# Patient Record
Sex: Male | Born: 1990 | Race: Black or African American | Hispanic: No | Marital: Single | Smoking: Former smoker
Health system: Southern US, Community
[De-identification: ages and names within clinical notes are randomized; demographics above are authoritative.]

---
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
## 2013-10-11 ENCOUNTER — Emergency Department (HOSPITAL_COMMUNITY)
Admission: EM | Admit: 2013-10-11 | Discharge: 2013-10-11 | Disposition: A | Discharge status: Home / Self Care | Payer: Self-pay | Attending: Emergency Medicine | Admitting: Emergency Medicine

## 2013-10-11 ENCOUNTER — Encounter (HOSPITAL_COMMUNITY): Payer: Self-pay | Admitting: Emergency Medicine

## 2013-10-11 DIAGNOSIS — Z87891 Personal history of nicotine dependence: Secondary | ICD-10-CM | POA: Insufficient documentation

## 2013-10-11 DIAGNOSIS — N342 Other urethritis: Secondary | ICD-10-CM | POA: Insufficient documentation

## 2013-10-11 DIAGNOSIS — R300 Dysuria: Secondary | ICD-10-CM | POA: Insufficient documentation

## 2013-10-11 LAB — URINALYSIS, ROUTINE W REFLEX MICROSCOPIC
BILIRUBIN URINE: NEGATIVE
Glucose, UA: NEGATIVE mg/dL
HGB URINE DIPSTICK: NEGATIVE
Ketones, ur: NEGATIVE mg/dL
NITRITE: NEGATIVE
PH: 6.5 (ref 5.0–8.0)
Protein, ur: NEGATIVE mg/dL
Specific Gravity, Urine: 1.007 (ref 1.005–1.030)
UROBILINOGEN UA: 0.2 mg/dL (ref 0.0–1.0)

## 2013-10-11 LAB — RAPID URINE DRUG SCREEN, HOSP PERFORMED
Amphetamines: NOT DETECTED
BENZODIAZEPINES: NOT DETECTED
Barbiturates: NOT DETECTED
COCAINE: NOT DETECTED
OPIATES: NOT DETECTED
Tetrahydrocannabinol: POSITIVE — AB

## 2013-10-11 LAB — RPR

## 2013-10-11 LAB — URINE MICROSCOPIC-ADD ON

## 2013-10-11 LAB — HIV ANTIBODY (ROUTINE TESTING W REFLEX): HIV 1&2 Ab, 4th Generation: NONREACTIVE

## 2013-10-11 MED ORDER — LIDOCAINE HCL (PF) 1 % IJ SOLN
2.1000 mL | Freq: Once | INTRAMUSCULAR | Status: AC
  Administered 2013-10-11: 2.1 mL
  Filled 2013-10-11: qty 5

## 2013-10-11 MED ORDER — CEFTRIAXONE SODIUM 250 MG IJ SOLR
250.0000 mg | Freq: Once | INTRAMUSCULAR | Status: AC
  Administered 2013-10-11: 250 mg via INTRAMUSCULAR
  Filled 2013-10-11: qty 250

## 2013-10-11 MED ORDER — AZITHROMYCIN 250 MG PO TABS
2000.0000 mg | ORAL_TABLET | Freq: Once | ORAL | Status: DC
  Filled 2013-10-11 (×2): qty 8

## 2013-10-11 MED ORDER — AZITHROMYCIN 500 MG PO TABS
1000.0000 mg | ORAL_TABLET | ORAL | Status: AC
  Administered 2013-10-11: 1000 mg via ORAL
  Filled 2013-10-11: qty 2

## 2013-10-11 NOTE — ED Notes (Signed)
Paged and called x2

## 2013-10-11 NOTE — ED Notes (Signed)
Communication with Pharmacy to send 4 500mg  tabs.

## 2013-10-11 NOTE — ED Notes (Signed)
Pt in c/o penile pain during urination and penile discharge, states he wants to be checked for STDs, no known exposure

## 2013-10-11 NOTE — Discharge Instructions (Signed)
Urethritis Urethritis is an inflammation of the tube through which urine exits your bladder (urethra).  CAUSES Urethritis is often caused by an infection in your urethra. The infection can be viral, like herpes. The infection can also be bacterial, like gonorrhea. RISK FACTORS Risk factors of urethritis include:  Having sex without using a condom.  Having multiple sexual partners.  Having poor hygiene. SIGNS AND SYMPTOMS Symptoms of urethritis are less noticeable in women than in men. These symptoms include:  Burning feeling when you urinate (dysuria).  Discharge from your urethra.  Blood in your urine (hematuria).  Urinating more than usual. DIAGNOSIS  To confirm a diagnosis of urethritis, your health care provider will do the following:  Ask about your sexual history.  Perform a physical exam.  Have you provide a sample of your urine for lab testing.  Use a cotton swab to gently collect a sample from your urethra for lab testing. TREATMENT  It is important to treat urethritis. Depending on the cause, untreated urethritis may lead to serious genital infections and possibly infertility. Urethritis caused by a bacterial infection is treated with antibiotic medicine. All sexual partners must be treated.  HOME CARE INSTRUCTIONS  Do not have sex until the test results are known and treatment is completed, even if your symptoms go away before you finish treatment.  If you were prescribed an antibiotic, finish it all even if you start to feel better. SEEK MEDICAL CARE IF:   Your symptoms are not improved in 3 days.  Your symptoms are getting worse.  You develop abdominal pain or pelvic pain (in women).  You develop joint pain.  You have a fever. SEEK IMMEDIATE MEDICAL CARE IF:   You have severe pain in the belly, back, or side.  You have repeated vomiting. MAKE SURE YOU:  Understand these instructions.  Will watch your condition.  Will get help right away if you  are not doing well or get worse. Document Released: 09/02/2000 Document Revised: 07/24/2013 Document Reviewed: 11/07/2012 [facility] Patient Information 2015 [place], [place]. This information is not intended to replace advice given to you by your health care provider. Make sure you discuss any questions you have with your health care provider.  Sexually Transmitted Disease A sexually transmitted disease (STD) is a disease or infection that may be passed (transmitted) from person to person, usually during sexual activity. This may happen by way of saliva, semen, blood, vaginal mucus, or urine. Common STDs include:   Gonorrhea.   Chlamydia.   Syphilis.   HIV and AIDS.   Genital herpes.   Hepatitis B and C.   Trichomonas.   Human papillomavirus (HPV).   Pubic lice.   Scabies.  Mites.  Bacterial vaginosis. WHAT ARE CAUSES OF STDs? An STD may be caused by bacteria, a virus, or parasites. STDs are often transmitted during sexual activity if one person is infected. However, they may also be transmitted through nonsexual means. STDs may be transmitted after:   Sexual intercourse with an infected person.   Sharing sex toys with an infected person.   Sharing needles with an infected person or using unclean piercing or tattoo needles.  Having intimate contact with the genitals, mouth, or rectal areas of an infected person.   Exposure to infected fluids during birth. WHAT ARE THE SIGNS AND SYMPTOMS OF STDs? Different STDs have different symptoms. Some people may not have any symptoms. If symptoms are present, they may include:   Painful or bloody urination.   Pain  in the pelvis, abdomen, vagina, anus, throat, or eyes.   A skin rash, itching, or irritation.  Growths, ulcerations, blisters, or sores in the genital and anal areas.  Abnormal vaginal discharge with or without bad odor.   Penile discharge in men.   Fever.   Pain or bleeding during sexual  intercourse.   Swollen glands in the groin area.   Yellow skin and eyes (jaundice). This is seen with hepatitis.   Swollen testicles.  Infertility.  Sores and blisters in the mouth. HOW ARE STDs DIAGNOSED? To make a diagnosis, your health care provider may:   Take a medical history.   Perform a physical exam.   Take a sample of any discharge to examine.  Swab the throat, cervix, opening to the penis, rectum, or vagina for testing.  Test a sample of your first morning urine.   Perform blood tests.   Perform a Pap test, if this applies.   Perform a colposcopy.   Perform a laparoscopy.  HOW ARE STDs TREATED? Treatment depends on the STD. Some STDs may be treated but not cured.   Chlamydia, gonorrhea, trichomonas, and syphilis can be cured with antibiotic medicine.   Genital herpes, hepatitis, and HIV can be treated, but not cured, with prescribed medicines. The medicines lessen symptoms.   Genital warts from HPV can be treated with medicine or by freezing, burning (electrocautery), or surgery. Warts may come back.   HPV cannot be cured with medicine or surgery. However, abnormal areas may be removed from the cervix, vagina, or vulva.   If your diagnosis is confirmed, your recent sexual partners need treatment. This is true even if they are symptom-free or have a negative culture or evaluation. They should not have sex until their health care providers say it is okay. HOW CAN I REDUCE MY RISK OF GETTING AN STD? Take these steps to reduce your risk of getting an STD:  Use latex condoms, dental dams, and water-soluble lubricants during sexual activity. Do not use petroleum jelly or oils.  Avoid having multiple sex partners.  Do not have sex with someone who has other sex partners.  Do not have sex with anyone you do not know or who is at high risk for an STD.  Avoid risky sex practices that can break your skin.  Do not have sex if you have open sores  on your mouth or skin.  Avoid drinking too much alcohol or taking illegal drugs. Alcohol and drugs can affect your judgment and put you in a vulnerable position.  Avoid engaging in oral and anal sex acts.  Get vaccinated for HPV and hepatitis. If you have not received these vaccines in the past, talk to your health care provider about whether one or both might be right for you.   If you are at risk of being infected with HIV, it is recommended that you take a prescription medicine daily to prevent HIV infection. This is called pre-exposure prophylaxis (PrEP). You are considered at risk if:  You are a man who has sex with other men (MSM).  You are a heterosexual man or woman and are sexually active with more than one partner.  You take drugs by injection.  You are sexually active with a partner who has HIV.  Talk with your health care provider about whether you are at high risk of being infected with HIV. If you choose to begin PrEP, you should first be tested for HIV. You should then be tested every  3 months for as long as you are taking PrEP.  WHAT SHOULD I DO IF I THINK I HAVE AN STD?  See your health care provider.   Tell your sexual partner(s). They should be tested and treated for any STDs.  Do not have sex until your health care provider says it is okay. WHEN SHOULD I GET IMMEDIATE MEDICAL CARE? Contact your health care provider right away if:   You have severe abdominal pain.  You are a man and notice swelling or pain in your testicles.  You are a woman and notice swelling or pain in your vagina. Document Released: 05/30/2002 Document Revised: 03/14/2013 Document Reviewed: 09/27/2012 [facility] Patient Information 2015 [place], [place]. This information is not intended to replace advice given to you by your health care provider. Make sure you discuss any questions you have with your health care provider.   Emergency Department Resource Guide 1) Find a Doctor and Pay Out  of Pocket Although you won't have to find out who is covered by your insurance plan, it is a good idea to ask around and get recommendations. You will then need to call the office and see if the doctor you have chosen will accept you as a new patient and what types of options they offer for patients who are self-pay. Some doctors offer discounts or will set up payment plans for their patients who do not have insurance, but you will need to ask so you aren't surprised when you get to your appointment.  2) Contact Your Local Health Department Not all health departments have doctors that can see patients for sick visits, but many do, so it is worth a call to see if yours does. If you don't know where your local health department is, you can check in your phone book. The CDC also has a tool to help you locate your state's health department, and many state websites also have listings of all of their local health departments.  3) Find a Walk-in Clinic If your illness is not likely to be very severe or complicated, you may want to try a walk in clinic. These are popping up all over the country in pharmacies, drugstores, and shopping centers. They're usually staffed by nurse practitioners or physician assistants that have been trained to treat common illnesses and complaints. They're usually fairly quick and inexpensive. However, if you have serious medical issues or chronic medical problems, these are probably not your best option.  No Primary Care Doctor: - Call Health Connect at  [phone] - they can help you locate a primary care doctor that  accepts your insurance, provides certain services, etc. - Physician Referral Service- [phone]  Chronic Pain Problems: Organization         Address  Phone   Notes  [facility]  [phone] Patients need to be referred by their primary care doctor.   Medication Assistance: Organization         Address  Phone   Notes  [facility]  Medication [facility] [address] [place] [place], [place] [address] [phone] --Must be a resident of [facility] -- Must have NO insurance coverage whatsoever (no Medicaid/ Medicare, etc.) -- The pt. MUST have a primary care doctor that directs their care regularly and follows them in the community   [facility]ist  [phone]   [employer]  [phone]    Agencies that provide inexpensive medical care: Organization  Address  Phone   Notes  [facility]  [phone]   [facility]    [phone]   [facility] [address] [place], [place] [address] [phone]   [facility] [address] [place], [place] [phone]   Planned Parenthood    [phone]   [facility]    [phone]   [facility]  [address], [place] Phone:  [phone], Fax:  [phone] Hours of Operation:  9 am - 6 pm, M-F.  Also accepts Medicaid/Medicare and self-pay.  [facility]  [address], [place] Phone: [phone], Fax: [phone]. Hours of Operation:  8:30 am - 5:30 pm, M-F.  Also accepts Medicaid and self-pay.  [facility]: Lambert Rhonda W [place] [address], [place] Phone: [phone]   [facility] [address] Natasha Bence [place], [place] [phone], Ext. [phone] Mondays & Thursdays: 7-9 AM.  First 15 patients are seen on a first come, first serve basis.    Medicaid-accepting [facility] Providers:  Organization         Address  Phone   Notes  [facility] [address], [place] [phone] Also accepts self-pay patients.  [facility] [address] Laurell Josephs [place], [place]  [phone]   [facility] [address], [place] [phone]   [facility] [address] [place] [phone]   Renaye Rakers [address], [place]   [phone] Only accepts [place] Access [place] patients after they have their name applied to their card.   Self-Pay (no insurance) in [facility]:  Organization         Address  Phone   Notes  Sickle Cell Patients, [facility] [address] [place], [place] [phone]   [facility] [address] [place], [place] [phone]   [facility] [place]  [address], [place] [phone]   [facility]/Dr. Osei-Bonsu  [address], [place] or [address], [place] [phone] Phone number for both [place] and [place] locations is the same.  [facility] [address], [place] [phone]   [facility] [address] [place] or [address] [phone]   [facility] [address] [place] [phone], phone; [phone], fax Sees patients 1st and 3rd Saturday of every month.  Must not qualify for public or private insurance (i.e. Medicaid, Medicare, [place] Health Choice, Veterans' Benefits)  Household income should be no more than 200% of the poverty level The clinic cannot treat you if you are pregnant or think you are pregnant  Sexually transmitted diseases are not treated at the clinic.    Dental Care: Organization         Address  Phone  Notes  [facility] [address] [place], [place] [phone] Accepts children up to age 70 who are enrolled in [place] or [place] Health Choice; pregnant women with a Medicaid card; and children who have applied for Medicaid or [place] Health Choice, but were declined, whose parents can pay a reduced fee at time of  service.  [facility] [place]  [address], [place]  [phone] Accepts children up to age 21 who are enrolled in [place] or [place] Health Choice; pregnant women with a Medicaid card; and children who have applied for Medicaid or [place] Health Choice, but were declined, whose parents can pay a reduced fee at time of service.  [facility]  [address], [place] [phone] Patients are seen by appointment only. Walk-ins are not accepted. [facility] will see patients 21 years of age and older. Monday - Tuesday (8am-5pm) Most Wednesdays (8:30-5pm) $30 per visit, cash only  [facility]  [address], [facility] [phone] Patients are seen by appointment only. Walk-ins are not accepted. [facility] will see patients 21 years of age and older. One Wednesday Evening (Monthly: Volunteer Based).  $30 per visit, cash only  [employer] [facility] [facility]  [phone] for adults; Children under age 164, call Graduate Pediatric Dentistry at [phone]. Children aged 704-14, please call [phone] to request a pediatric application.  Dental services are provided in all areas of dental care including fillings, crowns and bridges, complete and partial dentures, implants, gum treatment, root canals, and extractions. Preventive care is also provided. Treatment is provided to both adults and children. Patients are selected via a lottery and there is often a waiting list.   [facility] [address], [place]  [phone] [email or web address]   [facility] [address], [place], [place] [phone], Ext. [phone] Second and Fourth Thursday of each month, opens at 6:30 AM; Clinic ends at 9 AM.  Patients are seen on a first-come first-served basis, and a limited number are seen during each clinic.   [facility]  [address], [place], [place] [phone]   Eligibility Requirements You must have lived in [place], [place], or [place]  counties for at least the last three months.   You cannot be eligible for state or federal sponsored [place] Cityhealthcare insurance, including CIGNAVeterans Administration, [place]icaid, or [employer] EntertainmentMedicare.   You generally cannot be eligible for healthcare insurance through your employer.    How to apply: Eligibility screenings are held every Tuesday and Wednesday afternoon from 1:00 pm until 4:00 pm. You do not need an appointment for the interview!  [facility] [address], [place], [place] [phone]   [facility]  [phone]   [facility]  [phone]   [facility]  [phone]    Behavioral Health Resources in the Community: Intensive Outpatient Programs Organization         Address  Phone  Notes  [facility] [address], [place], [place] [phone]   [facility] [address], [place], [place] [phone]   [facility] [address], [place], [place]  [phone]   [facility] [address],  [place], [place] [phone] or [phone]   Substance Abuse Resources Organization         Address  Phone  Notes  [facility]  [phone]   [facility]  [phone]   [facility]  [phone]   [facility]  [phone]   Residential & Outpatient Substance Abuse Program  [phone]   Psychological Services Organization         Address  Phone  Notes  [facility]  [phone]   [facility]  [phone]   [facility] [address] [place] [phone] or [phone]    Mobile Crisis Teams Organization         Address  Phone  Notes  [facility], Mobile Crisis Care Unit  [phone]   [facility]  [address] [place], [place] [phone]   Doristine Locks [address] [place]  [place] [phone]    Self-Help/Support Groups Organization         Address  Phone             Notes  [facility] [place] - variety of support groups  [phone] Call for more information  Narcotics Anonymous (NA), [facility] [address], [employer] [place]  2 meetings at this location   Statistician         Address  Phone  Notes  [facility] [address],    [place]  [phone]   [facility]  [address], [place] [address], [place], [place] [phone]   [facility] [address] [place], [place] [phone] Admissions: 8am-3pm M-F  [facility] [address],    [place], [place] [phone]   [facility] [address] [place], [place], [place] [phone]   [facility] [address]  [place], [place] [phone]   [facility] - Intensive Outpatient [address], [place], [place] [phone]   [facility] ([facility] [address] [place]  [place], [place] [phone] or [phone]   [facility] ([facility]) [address] [place], [place] [phone] Accepts Medicaid  [place] [address]  [place] [phone] Substance Abuse/Addiction Treatment   [facility] Organization         Address  Phone  Notes  [facility]  [phone]   Angie Fava, PhD [address] Ervin Knack [place], [place]   [phone] or [phone]   [facility]   [address] [place], [place] [phone]   [facility] [address] [place], [place] [phone] Insurance/Medicaid/sponsorship through [facility] [address]                                    [place], [place] [phone] Therapy/tele-psych/case  [facility] [address], [place] [phone]    Dr. Lolly Mustache  [phone]   [facility]  [employer] [facility] Dept. 1) [address], [place] 2) [address], [place] 3)  [address], [place] [phone]  [phone]   [facility] Child Abuse Hotline [phone] or [phone] (After Hours)

## 2013-10-11 NOTE — ED Notes (Signed)
Last sexual encounter about a week ago.

## 2013-10-11 NOTE — ED Provider Notes (Signed)
CSN: [id number]     Arrival date & time 10/11/13  1453 History   This chart was scribed for Terri Piedraourtney Forcucci, PA-C working with Doug SouSam Jacubowitz, MD by Evon Slackerrance Branch, ED Scribe. This patient was seen in room TR05C/TR05C and the patient's care was started at 4:35 PM.      Chief Complaint  Patient presents with  . SEXUALLY TRANSMITTED DISEASE   The history is provided by the patient. No language interpreter was used.   HPI Comments: Jorge Williamson is a 23 y.o. male who presents to the Emergency Department complaining of intermittent dysuria onset 1 week prior. He states that he has associated white penile discharge, penile swelling initially after having sex. He states that he has recently had 2 new partners over the past 3 months. He states that he has unprotected sex. He states that he has never had a history of std previously. He denies hematuria, genital sores, testicle pain, abdominal pain, SOB, fever, chills, nausea, or vomiting.   He is also wanting to have a urine drug screen.   History reviewed. No pertinent past medical history. History reviewed. No pertinent past surgical history. History reviewed. No pertinent family history. History  Substance Use Topics  . Smoking status: Former Games developermoker  . Smokeless tobacco: Not on file  . Alcohol Use: Not on file    Review of Systems  Constitutional: Negative for fever and chills.  Respiratory: Negative for shortness of breath.   Gastrointestinal: Negative for nausea, vomiting and abdominal pain.  Genitourinary: Positive for dysuria, discharge and penile swelling. Negative for hematuria, penile pain and testicular pain.    Allergies  Review of patient's allergies indicates no known allergies.  Home Medications   Prior to Admission medications   Not on File   Triage Vitals: BP 125/66  Pulse 57  Temp(Src) 98.6 F (37 C) (Oral)  Resp 20  SpO2 99%  Physical Exam  Nursing note and vitals reviewed. Constitutional: He is  oriented to person, place, and time. He appears well-developed and well-nourished. No distress.  HENT:  Head: Normocephalic and atraumatic.  Mouth/Throat: Oropharynx is clear and moist. No oropharyngeal exudate.  Eyes: Conjunctivae and EOM are normal. Pupils are equal, round, and reactive to light. No scleral icterus.  Neck: Normal range of motion. Neck supple. No tracheal deviation present. No thyromegaly present.  Cardiovascular: Normal rate, regular rhythm, normal heart sounds and intact distal pulses.  Exam reveals no gallop and no friction rub.   No murmur heard. Pulmonary/Chest: Effort normal and breath sounds normal. No respiratory distress. He has no wheezes. He has no rales. He exhibits no tenderness.  Abdominal: Soft. Bowel sounds are normal. He exhibits no distension and no mass. There is no tenderness. There is no rebound and no guarding.  Genitourinary: Testes normal. Right testis shows no mass, no swelling and no tenderness. Right testis is descended. Cremasteric reflex is not absent on the right side. Left testis shows no mass, no swelling and no tenderness. Left testis is descended. Cremasteric reflex is not absent on the left side. Circumcised. No phimosis, paraphimosis, hypospadias, penile erythema or penile tenderness. Discharge found.  White discharge expressed from the penis with palpation  Musculoskeletal: Normal range of motion.  Lymphadenopathy:    He has no cervical adenopathy.  Neurological: He is alert and oriented to person, place, and time.  Skin: Skin is warm and dry. He is not diaphoretic.  Psychiatric: He has a normal mood and affect. His behavior is normal. Judgment and  thought content normal.    ED Course  Procedures (including critical care time) DIAGNOSTIC STUDIES: Oxygen Saturation is 99% on RA, normal by my interpretation.    COORDINATION OF CARE:    Labs Review Labs Reviewed  URINALYSIS, ROUTINE W REFLEX MICROSCOPIC - Abnormal; Notable for the  following:    Leukocytes, UA TRACE (*)    All other components within normal limits  URINE RAPID DRUG SCREEN (HOSP PERFORMED) - Abnormal; Notable for the following:    Tetrahydrocannabinol POSITIVE (*)    All other components within normal limits  GC/CHLAMYDIA PROBE AMP  URINE MICROSCOPIC-ADD ON  RPR  HIV ANTIBODY (ROUTINE TESTING)    Imaging Review No results found.   EKG Interpretation None      MDM   Final diagnoses:  Urethritis   Patient is a 23 y.o. Male who presents tot he ED with penile discharge x 1 week.  Patient has history and physical exam consistent with urethritis.  STD panel is currently pending at this time.  UA shows trace leukocytes.  Urine drug screen was requested by the patient and performed and shows THC positive.  Patient was treated with azithromycin and ceftriaxone here at this time.  Patient was told to abstain from sexual activity at for 1 week.  He will be given the resource list.  He was educated on safe sex at this time.  Patient was told to return for worsening symptoms testicular pain or inability to urinate.     I personally performed the services described in this documentation, which was scribed in my presence. The recorded information has been reviewed and is accurate.      Eben Burow, PA-C 10/11/13 1843

## 2013-10-12 LAB — GC/CHLAMYDIA PROBE AMP
CT Probe RNA: NEGATIVE
GC PROBE AMP APTIMA: POSITIVE — AB

## 2013-10-12 NOTE — ED Provider Notes (Signed)
Medical screening examination/treatment/procedure(s) were performed by non-physician practitioner and as supervising physician I was immediately available for consultation/collaboration.   EKG Interpretation None       Quantavious Eggert, MD 10/12/13 0053 

## 2013-10-13 ENCOUNTER — Telehealth (HOSPITAL_BASED_OUTPATIENT_CLINIC_OR_DEPARTMENT_OTHER): Payer: Self-pay | Admitting: Emergency Medicine

## 2013-10-13 NOTE — Telephone Encounter (Signed)
Positive Gonorrhea culture  Treated with Rocephin and Zithromax [facility] faxed  Patient contacted 09/2413 ! 1403: ID verified, pt notified of positive gonorrhea and that treatment was given while in ED. STD instructions provided, patient verbalized understanding.

## 2013-11-27 ENCOUNTER — Encounter (HOSPITAL_COMMUNITY): Payer: Self-pay | Admitting: Emergency Medicine

## 2013-11-27 ENCOUNTER — Emergency Department (HOSPITAL_COMMUNITY)
Admission: EM | Admit: 2013-11-27 | Discharge: 2013-11-27 | Disposition: A | Discharge status: Home / Self Care | Payer: Worker's Compensation | Attending: Emergency Medicine | Admitting: Emergency Medicine

## 2013-11-27 ENCOUNTER — Emergency Department (HOSPITAL_COMMUNITY): Payer: Worker's Compensation

## 2013-11-27 DIAGNOSIS — W292XXA Contact with other powered household machinery, initial encounter: Secondary | ICD-10-CM | POA: Insufficient documentation

## 2013-11-27 DIAGNOSIS — Z23 Encounter for immunization: Secondary | ICD-10-CM | POA: Diagnosis not present

## 2013-11-27 DIAGNOSIS — S6990XA Unspecified injury of unspecified wrist, hand and finger(s), initial encounter: Secondary | ICD-10-CM | POA: Diagnosis present

## 2013-11-27 DIAGNOSIS — Z87891 Personal history of nicotine dependence: Secondary | ICD-10-CM | POA: Diagnosis not present

## 2013-11-27 DIAGNOSIS — Y9389 Activity, other specified: Secondary | ICD-10-CM | POA: Diagnosis not present

## 2013-11-27 DIAGNOSIS — S61209A Unspecified open wound of unspecified finger without damage to nail, initial encounter: Secondary | ICD-10-CM | POA: Diagnosis not present

## 2013-11-27 DIAGNOSIS — Y9289 Other specified places as the place of occurrence of the external cause: Secondary | ICD-10-CM | POA: Insufficient documentation

## 2013-11-27 DIAGNOSIS — Y990 Civilian activity done for income or pay: Secondary | ICD-10-CM | POA: Insufficient documentation

## 2013-11-27 DIAGNOSIS — S6980XA Other specified injuries of unspecified wrist, hand and finger(s), initial encounter: Secondary | ICD-10-CM | POA: Insufficient documentation

## 2013-11-27 DIAGNOSIS — T148XXA Other injury of unspecified body region, initial encounter: Secondary | ICD-10-CM | POA: Insufficient documentation

## 2013-11-27 DIAGNOSIS — S61219A Laceration without foreign body of unspecified finger without damage to nail, initial encounter: Secondary | ICD-10-CM

## 2013-11-27 DIAGNOSIS — W260XXA Contact with knife, initial encounter: Secondary | ICD-10-CM | POA: Diagnosis not present

## 2013-11-27 DIAGNOSIS — S61209D Unspecified open wound of unspecified finger without damage to nail, subsequent encounter: Secondary | ICD-10-CM

## 2013-11-27 DIAGNOSIS — W261XXA Contact with sword or dagger, initial encounter: Secondary | ICD-10-CM

## 2013-11-27 MED ORDER — OXYCODONE-ACETAMINOPHEN 5-325 MG PO TABS
1.0000 | ORAL_TABLET | Freq: Once | ORAL | Status: DC
  Filled 2013-11-27: qty 1

## 2013-11-27 MED ORDER — HYDROCODONE-ACETAMINOPHEN 5-325 MG PO TABS: 1.0000 | ORAL_TABLET | Freq: Four times a day (QID) | ORAL | Status: DC | PRN

## 2013-11-27 MED ORDER — CEPHALEXIN 500 MG PO CAPS: 500.0000 mg | ORAL_CAPSULE | Freq: Three times a day (TID) | ORAL | Status: DC

## 2013-11-27 MED ORDER — TETANUS-DIPHTH-ACELL PERTUSSIS 5-2.5-18.5 LF-MCG/0.5 IM SUSP
0.5000 mL | Freq: Once | INTRAMUSCULAR | Status: AC
  Administered 2013-11-27: 0.5 mL via INTRAMUSCULAR
  Filled 2013-11-27: qty 0.5

## 2013-11-27 NOTE — ED Notes (Signed)
Patient here with complaint of right middle finger injury after cutting it with a kitchen knife. Patient works at [employer]. Skin from tip of finger was sliced off in process of injury. Moderate bleeding noted.

## 2013-11-27 NOTE — ED Provider Notes (Signed)
Medical screening examination/treatment/procedure(s) were performed by non-physician practitioner and as supervising physician I was immediately available for consultation/collaboration.   Autumne Kallio, MD 11/27/13 0638 

## 2013-11-27 NOTE — ED Provider Notes (Signed)
CSN: [id number]     Arrival date & time 11/27/13  1425 History   First MD Initiated Contact with Patient 11/27/13 1603     This chart was scribed for non-physician practitioner, Allen Derry PA-C working with Gilda Crease, * by Arlan Organ, ED Scribe. This patient was seen in room TR05C/TR05C and the patient's care was started at 4:10 PM.   Chief Complaint  Patient presents with  . Finger Injury   Patient is a 23 y.o. male presenting with hand injury.  Hand Injury Location:  Finger Time since incident:  1 day Injury: yes   Finger location:  R middle finger Pain details:    Radiates to:  Does not radiate   Severity:  Mild   Progression:  Unchanged Chronicity:  New Handedness:  Right-handed Dislocation: no   Foreign body present:  No foreign bodies Tetanus status:  Up to date Prior injury to area:  No Associated symptoms: no fever     HPI Comments: Jorge Williamson is a 23 y.o. male who presents to the Emergency Department complaining of a laceration to the R 3rd digit sustained last night. Pt states he was cleaning a meat slicer at work when he cut himself. He was [facility] after incident and was treated with wound seal to the laceration. Pt was advised to follow up with hand specialist but to return to the ED if the finger started to bleed again. He states that he removed the bandaid today and it bled a little, although since being here it's stopped. States it was a small amount of blood. He denies any fevers, chills, red streaking, lightheadedness, CP, SOB, or dizziness. He has no pertinent past medical history. No other concerns this visit.   History reviewed. No pertinent past medical history. History reviewed. No pertinent past surgical history. History reviewed. No pertinent family history. History  Substance Use Topics  . Smoking status: Former Games developer  . Smokeless tobacco: Not on file  . Alcohol Use: No    Review of Systems   Constitutional: Negative for fever and chills.  Respiratory: Negative for shortness of breath.   Skin: Positive for wound.  Neurological: Negative for weakness, light-headedness and numbness.  A complete 10 system review of systems was obtained and all systems are negative except as noted in the HPI and PMH.    Allergies  Review of patient's allergies indicates no known allergies.  Home Medications   Prior to Admission medications   Medication Sig Start Date End Date Taking? Authorizing Provider  cephALEXin (KEFLEX) 500 MG capsule Take 1 capsule (500 mg total) by mouth 3 (three) times daily. 11/27/13   Courtney A Forcucci, PA-C  HYDROcodone-acetaminophen (NORCO/VICODIN) 5-325 MG per tablet Take 1 tablet by mouth every 6 (six) hours as needed for moderate pain or severe pain. 11/27/13   Courtney A Forcucci, PA-C   Triage Vitals: BP 115/68  Pulse 58  Temp(Src) 98.6 F (37 C) (Oral)  Resp 18  Wt 140 lb (63.504 kg)  SpO2 99%   Physical Exam  Nursing note and vitals reviewed. Constitutional: He is oriented to person, place, and time. Vital signs are normal. He appears well-developed and well-nourished. No distress.  HENT:  Head: Normocephalic and atraumatic.  Mouth/Throat: Mucous membranes are normal.  Eyes: Conjunctivae and EOM are normal.  Neck: Normal range of motion.  Cardiovascular: Normal rate and intact distal pulses.   Intact distal pulses, good cap refill  Pulmonary/Chest: Effort normal. No respiratory distress.  Abdominal: Normal appearance. He exhibits no distension.  Musculoskeletal: Normal range of motion.       Right hand: He exhibits tenderness and deformity. He exhibits normal two-point discrimination, normal capillary refill and no swelling. Normal sensation noted. Normal strength noted.       Hands: R middle finger with TTP over pad, where wound seal is placed over the avulsed pad. No active bleeding. Sensation in surrounding areas intact, strength 5/5 in all digits,  FROM intact in R middle finger at all joints. No erythema or swelling to the area, no red streaking  Neurological: He is alert and oriented to person, place, and time. He has normal strength. No sensory deficit.  Skin: Skin is warm and dry. Lesion noted.  R middle finger pad with wound seal, no bleeding  Psychiatric: He has a normal mood and affect.    ED Course  Irrigation Date/Time: 11/27/2013 5:10 PM Performed by: Marjean Donna, Josephanthony Tindel STRUPP Authorized by: Ramond Marrow Consent: Verbal consent obtained. Risks and benefits: risks, benefits and alternatives were discussed Consent given by: patient Patient understanding: patient states understanding of the procedure being performed Patient consent: the patient's understanding of the procedure matches consent given Patient identity confirmed: verbally with patient Preparation: Patient was prepped and draped in the usual sterile fashion. Local anesthesia used: no Patient sedated: no Patient tolerance: Patient tolerated the procedure well with no immediate complications. Comments: Irrigated and placed wound seal to R middle finger tip   (including critical care time)  DIAGNOSTIC STUDIES: Oxygen Saturation is 99% on RA, Normal by my interpretation.    COORDINATION OF CARE: 4:10 PM- Will apply additional wound seal to finger. Advised pt to contact hand specialist tomorrow for follow up. Discussed treatment plan with pt at bedside and pt agreed to plan.     Labs Review Labs Reviewed - No data to display  Imaging Review Dg Finger Middle Right  11/27/2013   CLINICAL DATA:  Laceration to the tip of middle finger with the meet slight her.  EXAM: RIGHT MIDDLE FINGER 2+V  COMPARISON:  None.  FINDINGS: Soft tissue defect along the distal aspect of the right third finger. Overlying bandage material obscures some detail but no radiopaque foreign bodies are suggested. Underlying bones appear intact. No acute fracture or  dislocation.  IMPRESSION: Soft tissue defect of the distal right third finger. No radiopaque soft tissue foreign bodies. No acute bony abnormalities.   Electronically Signed   By: Burman Nieves M.D.   On: 11/27/2013 03:06     EKG Interpretation None      MDM   Final diagnoses:  Avulsion, finger tip, subsequent encounter   22y/o male with R middle finger tip avulsion, seen yesterday and told to return if bleeding occurred. Small amount of bleeding today after pulling band aid off. Placed more wound seal to this area after irrigating, and recovered with telfa and coban. Few pills of norco given since pt states he's running low at home. Discussed f/up with Merlyn Lot as stated yesterday. Pt afebrile w/ no signs of infection. I explained the diagnosis and have given explicit precautions to return to the ER including for any other new or worsening symptoms. The patient understands and accepts the medical plan as it's been dictated and I have answered their questions. Discharge instructions concerning home care and prescriptions have been given. The patient is STABLE and is discharged to home in good condition.   I personally performed the services described in this documentation, which was scribed  in my presence. The recorded information has been reviewed and is accurate.  BP 109/76  Pulse 59  Temp(Src) 98.6 F (37 C) (Oral)  Resp 12  Wt 140 lb (63.504 kg)  SpO2 97%  Meds ordered this encounter  Medications  . HYDROcodone-acetaminophen (NORCO) 5-325 MG per tablet    Sig: Take 1-2 tablets by mouth every 6 (six) hours as needed for severe pain.    Dispense:  6 tablet    Refill:  0    Order Specific Question:  Supervising Provider    Answer:  Vida Roller [address] Camprub[place]-Soms, PA-C 11/27/13 1725

## 2013-11-27 NOTE — Discharge Instructions (Signed)
Keep wound and clean with mild soap and water. Keep area covered with a topical bandage, keep bandage dry, and do not submerge in water for 24 hours. Ice and elevate for additional pain relief and swelling. Alternate between Ibuprofen and Tylenol for additional pain relief, or use norco as directed but don't drive while using this medication. Follow up with Dr. Merlyn Lot as you were instructed to do yesterday. Monitor area for signs of infection to include, but not limited to: increasing pain, redness, drainage/pus, or swelling. Return to emergency department for emergent changing or worsening symptoms.    Fingertip Injuries and Amputations Fingertip injuries are common and often get injured because they are last to escape when pulling your hand out of harm's way. You have amputated (cut off) part of your finger. How this turns out depends largely on how much was amputated. If just the tip is amputated, often the end of the finger will grow back and the finger may return to much the same as it was before the injury.  If more of the finger is missing, your caregiver has done the best with the tissue remaining to allow you to keep as much finger as is possible. Your caregiver after checking your injury has tried to leave you with a painless fingertip that has durable, feeling skin. If possible, your caregiver has tried to maintain the finger's length and appearance and preserve its fingernail.  Please read the instructions outlined below and refer to this sheet in the next few weeks. These instructions provide you with general information on caring for yourself. Your caregiver may also give you specific instructions. While your treatment has been done according to the most current medical practices available, unavoidable complications occasionally occur. If you have any problems or questions after discharge, please call your caregiver. HOME CARE INSTRUCTIONS   You may resume normal diet and activities as  directed or allowed.  Keep your hand elevated above the level of your heart. This helps decrease pain and swelling.  Keep ice packs (or a bag of ice wrapped in a towel) on the injured area for 15-20 minutes, 03-04 times per day, for the first two days.  Change dressings if necessary or as directed.  Clean the wound daily or as directed.  Only take over-the-counter or prescription medicines for pain, discomfort, or fever as directed by your caregiver.  Keep appointments as directed. SEEK IMMEDIATE MEDICAL CARE IF:  You develop redness, swelling, numbness or increasing pain in the wound.  There is pus coming from the wound.  You develop an unexplained oral temperature above 102 F (38.9 C) or as your caregiver suggests.  There is a foul (bad) smell coming from the wound or dressing.  There is a breaking open of the wound (edges not staying together) after sutures or staples have been removed. MAKE SURE YOU:   Understand these instructions.  Will watch your condition.  Will get help right away if you are not doing well or get worse. Document Released: 01/28/2005 Document Revised: 06/01/2011 Document Reviewed: 12/28/2007 [facility] Patient Information 2015 [place], [place]. This information is not intended to replace advice given to you by your health care provider. Make sure you discuss any questions you have with your health care provider.  Laceration Care, Adult A laceration is a cut that goes through all layers of the skin. The cut goes into the tissue beneath the skin. HOME CARE For stitches (sutures) or staples:  Keep the cut clean and dry.  If  you have a bandage (dressing), change it at least once a day. Change the bandage if it gets wet or dirty, or as told by your doctor.  Wash the cut with soap and water 2 times a day. Rinse the cut with water. Pat it dry with a clean towel.  Put a thin layer of medicated cream on the cut as told by your doctor.  You may shower  after the first 24 hours. Do not soak the cut in water until the stitches are removed.  Only take medicines as told by your doctor.  Have your stitches or staples removed as told by your doctor. For skin adhesive strips:  Keep the cut clean and dry.  Do not get the strips wet. You may take a bath, but be careful to keep the cut dry.  If the cut gets wet, pat it dry with a clean towel.  The strips will fall off on their own. Do not remove the strips that are still stuck to the cut. For wound glue:  You may shower or take baths. Do not soak or scrub the cut. Do not swim. Avoid heavy sweating until the glue falls off on its own. After a shower or bath, pat the cut dry with a clean towel.  Do not put medicine on your cut until the glue falls off.  If you have a bandage, do not put tape over the glue.  Avoid lots of sunlight or tanning lamps until the glue falls off. Put sunscreen on the cut for the first year to reduce your scar.  The glue will fall off on its own. Do not pick at the glue. You may need a tetanus shot if:  You cannot remember when you had your last tetanus shot.  You have never had a tetanus shot. If you need a tetanus shot and you choose not to have one, you may get tetanus. Sickness from tetanus can be serious. GET HELP RIGHT AWAY IF:   Your pain does not get better with medicine.  Your arm, hand, leg, or foot loses feeling (numbness) or changes color.  Your cut is bleeding.  Your joint feels weak, or you cannot use your joint.  You have painful lumps on your body.  Your cut is red, puffy (swollen), or painful.  You have a red line on the skin near the cut.  You have yellowish-white fluid (pus) coming from the cut.  You have a fever.  You have a bad smell coming from the cut or bandage.  Your cut breaks open before or after stitches are removed.  You notice something coming out of the cut, such as wood or glass.  You cannot move a finger or  toe. MAKE SURE YOU:   Understand these instructions.  Will watch your condition.  Will get help right away if you are not doing well or get worse. Document Released: 08/26/2007 Document Revised: 06/01/2011 Document Reviewed: 09/02/2010 [facility] Patient Information 2015 [place], [place]. This information is not intended to replace advice given to you by your health care provider. Make sure you discuss any questions you have with your health care provider.

## 2013-11-27 NOTE — ED Provider Notes (Signed)
CSN: [id number]     Arrival date & time 11/27/13  0056 History   First MD Initiated Contact with Patient 11/27/13 0100     Chief Complaint  Patient presents with  . Extremity Laceration  . Finger Injury   HPI Comments: Patient is a 23 y.o. Male who presents to the ED after cutting his finger at work.  Patient states that he was using the deli slicer and sliced the pad of his right middle finger off.  Patient states that he immediately washed the area with soap and water and then cleaned it with peroxide and came straight here.  Patient does not remember when his last tdap was.  Patient states that he has had no tingling, numbness, or problems with flexing or extending his finger.  Patient denies diabetes or HIV.  Patient denies any previous injury to finger.   The history is provided by the patient. No language interpreter was used.    History reviewed. No pertinent past medical history. History reviewed. No pertinent past surgical history. History reviewed. No pertinent family history. History  Substance Use Topics  . Smoking status: Former Games developer  . Smokeless tobacco: Not on file  . Alcohol Use: Not on file    Review of Systems  All other systems reviewed and are negative.     Allergies  Review of patient's allergies indicates no known allergies.  Home Medications   Prior to Admission medications   Medication Sig Start Date End Date Taking? Authorizing Provider  cephALEXin (KEFLEX) 500 MG capsule Take 1 capsule (500 mg total) by mouth 3 (three) times daily. 11/27/13   Jamira Barfuss A Forcucci, PA-C  HYDROcodone-acetaminophen (NORCO/VICODIN) 5-325 MG per tablet Take 1 tablet by mouth every 6 (six) hours as needed for moderate pain or severe pain. 11/27/13   Caidin Heidenreich A Forcucci, PA-C   BP 124/78  Pulse 70  Temp(Src) 98.2 F (36.8 C) (Oral)  Resp 20  Ht  (1.803 m)  Wt 140 lb (63.504 kg)  BMI 19.53 kg/m2  SpO2 99% Physical Exam  Nursing note and vitals  reviewed. Constitutional: He appears well-developed and well-nourished. No distress.  HENT:  Head: Normocephalic and atraumatic.  Mouth/Throat: Oropharynx is clear and moist. No oropharyngeal exudate.  Eyes: Conjunctivae and EOM are normal. Pupils are equal, round, and reactive to light. No scleral icterus.  Neck: Normal range of motion. Neck supple. No JVD present. No thyromegaly present.  Cardiovascular: Normal rate, regular rhythm, normal heart sounds and intact distal pulses.  Exam reveals no gallop and no friction rub.   No murmur heard. Pulmonary/Chest: Effort normal and breath sounds normal. No respiratory distress. He has no wheezes. He has no rales. He exhibits no tenderness.  Musculoskeletal:  Right middle finger has full sensation to palpation.  There is full flexion and extension of the right middle finger at the PIP and DIP joint. 5/5 finger flexion and extension of the right middle finger.    Lymphadenopathy:    He has no cervical adenopathy.  Skin: Skin is warm and dry. He is not diaphoretic.  Complete avulsion of the distal right middle finger pad with active bleeding.  There are no foreign bodies on palpation.  There is no visible bone or tendon involvement.  Psychiatric: He has a normal mood and affect. His behavior is normal. Judgment and thought content normal.    ED Course  Procedures (including critical care time) Labs Review Labs Reviewed - No data to display  Imaging Review Dg Finger  Middle Right  11/27/2013   CLINICAL DATA:  Laceration to the tip of middle finger with the meet slight her.  EXAM: RIGHT MIDDLE FINGER 2+V  COMPARISON:  None.  FINDINGS: Soft tissue defect along the distal aspect of the right third finger. Overlying bandage material obscures some detail but no radiopaque foreign bodies are suggested. Underlying bones appear intact. No acute fracture or dislocation.  IMPRESSION: Soft tissue defect of the distal right third finger. No radiopaque soft tissue  foreign bodies. No acute bony abnormalities.   Electronically Signed   By: Burman Nieves M.D.   On: 11/27/2013 03:06     EKG Interpretation None      MDM   Final diagnoses:  Finger laceration, initial encounter   Patient is a 23 y.o. Male who presents to the ED with right middle finger avulsion laceration.  Tetanus was updated here.  There is no tendon involvement on examination.  Finger was irrigated with 0.5L of NS and was dressed with non-adherent bandage and krillex after using wound clot.  Plain film xray was obtained.  There is no fracture or bony involvement.  Patient is stable for discharge at this time.  Will discharge the patient home in a static splint.  Patient to follow-up with Dr. Merlyn Lot in the office.  Patient will be discharged home with hydrocodone and keflex.  Patient was told to watch for signs of infection or bleeding which could not be stopped at home.  Patient states understanding and agreement at this time.      Eben Burow, PA-C 11/27/13 [id number]

## 2013-11-27 NOTE — ED Notes (Signed)
Pt reports that he was seen here last night after cutting his right middle finger. States that the area was wrapped and told to come back if the bleeding continued. States that bleeding has continued into today. Bleeding controlled at triage

## 2013-11-27 NOTE — ED Notes (Signed)
NAD at this time.  

## 2013-11-27 NOTE — Discharge Instructions (Signed)
Deep Skin Avulsion °A deep skin avulsion is when all layers of the skin or parts of body structures have been torn away. This is usually a result of severe injury (trauma). A deep skin avulsion can include damage to important structures beneath the skin such as tendons, ligaments, nerves, or blood vessels.  °CAUSES  °Many injuries can lead to a deep skin avulsion. These include:  °· Crush injuries. °· Bites. °· Falls against jagged surfaces. °· Gunshot wounds. °· Severe burns and injuries involving dragging (such as those from a bicycle or motorcycle accident). °TREATMENT  °· If the wound is small and there is no damage to vital structures like nerves and blood vessels, the damaged tissues may be removed. Then, the wound can be cleaned thoroughly and closed. °· A skin graft may be performed. This is a procedure in which the outer layer of skin is removed from a different part of your body. That skin (skin graft) is used to cover the open wound. This can happen after damaged tissue is removed and repairs are completed. °· Your caregiver may only apply a bandage (dressing) to the wound. The wound will be kept clean and allowed to heal. Healing can take weeks or months and usually leaves a large scar. This type of treatment is only done if your caregiver feels that skin grafting or a similar procedure would not work. °You might need a tetanus shot if: °· You cannot remember when you had your last tetanus shot. °· You have never had a tetanus shot. °· The injury broke your skin. °If you got a tetanus shot, your arm may swell, get red, and feel warm to the touch. This is common and not a problem. If you need a tetanus shot and you choose not to have one, there is a rare chance of getting tetanus. Sickness from tetanus can be serious. °HOME CARE INSTRUCTIONS  °· Only take over-the-counter or prescription medicines for pain, discomfort, or fever as directed by your caregiver. °· Gently wash the area with mild soap and  water 2 times a day, or as directed. Rinse off the soap. Pat the area dry with a clean towel. Do not rub the wound. This may cause bleeding. °· Follow your caregiver's instructions for how often you need to change the dressing. °· Apply ointment and a dressing to the wound as directed. °· If the dressing sticks, moisten it with soapy water and gently remove it. °· Change the bandage right away if it becomes wet, dirty, or starts to smell bad. °· Take showers. Do not take tub baths, swim, or do anything that may soak the wound until it is healed. °· Use anti-itch medicine as directed by your caregiver. The wound may itch when it is healing. Do not pick or scratch at the wound. °· Follow up with your caregiver for stitches (sutures), staple, or skin adhesive strip removal. °SEEK MEDICAL CARE IF:  °· You have redness, swelling, or increasing pain in your wound. °· A red streak or line extends away from the wound. °· You have pus coming from the wound. °· You notice a bad smell coming from the wound or dressing. °· The wound breaks open (edges not staying together) after sutures have been removed. °· You notice something coming out of the wound, such as a small piece of wood, glass, or metal. °· You are unable to properly move a finger or toe if the wound is on your hand or foot. °· You have severe   swelling around the wound that causes pain and numbness. °· Your arm, hand, leg, or foot changes color. °SEEK IMMEDIATE MEDICAL CARE IF:  °· Your pain becomes severe or is not adequately relieved with pain medicine. °· You have a fever. °· You have nausea and vomiting for more than 24 hours. °· You feel lightheaded, weak, or faint. °· You develop chest pain or difficulty breathing. °MAKE SURE YOU:  °· Understand these instructions. °· Will watch your condition. °· Will get help right away if you are not doing well or get worse. °Document Released: 05/05/2006 Document Revised: 06/01/2011 Document Reviewed:  07/13/2010 °ExitCare® Patient Information ©2015 [employer]. This information is not intended to replace advice given to you by your health care provider. Make sure you discuss any questions you have with your health care provider. ° °

## 2013-11-28 NOTE — ED Provider Notes (Signed)
Medical screening examination/treatment/procedure(s) were performed by non-physician practitioner and as supervising physician I was immediately available for consultation/collaboration.   EKG Interpretation None        Deidre Carino J. Kodee Ravert, MD 11/28/13 0104 

## 2014-01-04 ENCOUNTER — Emergency Department (HOSPITAL_COMMUNITY)
Admission: EM | Admit: 2014-01-04 | Discharge: 2014-01-04 | Disposition: A | Discharge status: Home / Self Care | Payer: Self-pay | Attending: Emergency Medicine | Admitting: Emergency Medicine

## 2014-01-04 ENCOUNTER — Encounter (HOSPITAL_COMMUNITY): Payer: Self-pay | Admitting: Emergency Medicine

## 2014-01-04 DIAGNOSIS — R369 Urethral discharge, unspecified: Secondary | ICD-10-CM | POA: Insufficient documentation

## 2014-01-04 DIAGNOSIS — Z792 Long term (current) use of antibiotics: Secondary | ICD-10-CM | POA: Insufficient documentation

## 2014-01-04 DIAGNOSIS — Z87891 Personal history of nicotine dependence: Secondary | ICD-10-CM | POA: Insufficient documentation

## 2014-01-04 LAB — HIV ANTIBODY (ROUTINE TESTING W REFLEX): HIV 1&2 Ab, 4th Generation: NONREACTIVE

## 2014-01-04 LAB — RPR

## 2014-01-04 MED ORDER — CEFTRIAXONE SODIUM 250 MG IJ SOLR
250.0000 mg | Freq: Once | INTRAMUSCULAR | Status: AC
  Administered 2014-01-04: 250 mg via INTRAMUSCULAR
  Filled 2014-01-04: qty 250

## 2014-01-04 MED ORDER — AZITHROMYCIN 250 MG PO TABS
1000.0000 mg | ORAL_TABLET | Freq: Once | ORAL | Status: AC
  Administered 2014-01-04: 1000 mg via ORAL
  Filled 2014-01-04: qty 4

## 2014-01-04 NOTE — Discharge Instructions (Signed)
°Emergency Department Resource Guide °1) Find a Doctor and Pay Out of Pocket °Although you won't have to find out who is covered by your insurance plan, it is a good idea to ask around and get recommendations. You will then need to call the office and see if the doctor you have chosen will accept you as a new patient and what types of options they offer for patients who are self-pay. Some doctors offer discounts or will set up payment plans for their patients who do not have insurance, but you will need to ask so you aren't surprised when you get to your appointment. ° °2) Contact Your Local Health Department °Not all health departments have doctors that can see patients for sick visits, but many do, so it is worth a call to see if yours does. If you don't know where your local health department is, you can check in your phone book. The CDC also has a tool to help you locate your state's health department, and many state websites also have listings of all of their local health departments. ° °3) Find a Walk-in Clinic °If your illness is not likely to be very severe or complicated, you may want to try a walk in clinic. These are popping up all over the country in pharmacies, drugstores, and shopping centers. They're usually staffed by nurse practitioners or physician assistants that have been trained to treat common illnesses and complaints. They're usually fairly quick and inexpensive. However, if you have serious medical issues or chronic medical problems, these are probably not your best option. ° °No Primary Care Doctor: °- Call [facility] at  832-8000 - they can help you locate a primary care doctor that  accepts your insurance, provides certain services, etc. °- Physician Referral Service- 1-800-533-3463 ° °Chronic Pain Problems: °Organization         Address  Phone   Notes  °[facility]  (336) 297-2271 Patients need to be referred by their primary care doctor.  ° °Medication  Assistance: °Organization         Address  Phone   Notes  °[place] [facility] [address] °[place], [place] [address] (336) 641-8030 --Must be a resident of [place] °-- Must have NO insurance coverage whatsoever (no Medicaid/ Medicare, etc.) °-- The pt. MUST have a primary care doctor that directs their care regularly and follows them in the community °  °[facility]  (866) 331-1348   °[employer]  (888) 892-1162   ° °Agencies that provide inexpensive medical care: °Organization         Address  Phone   Notes  °[facility]  (336) 832-8035   °[facility]    (336) 832-7272   °[facility] [address] °[place], [place] [address] (336) 832-4777   °[facility] [address], °[place] (336) 271-4999   °[facility]    (336) 373-0678   °[facility]    (336) 272-1050   °[facility] ° [address], [place] Phone:  (336) 832-4444, Fax:  (336) 832-4440 Hours of Operation:  9 am - 6 pm, M-F.  Also accepts Medicaid/Medicare and self-pay.  °[facility] ° [address], [place] Phone: (336) 832-3150, Fax: (336) 832-3151. Hours of Operation:  8:30 am - 5:30 pm, M-F.  Also accepts Medicaid and self-pay.  °[facility] [place] [address], [place] Phone: (336) 878-6027   °[facility] [address], [place], [place] (336)723-1848, Ext. 123 Mondays & Thursdays: 7-9 AM.  First 15 patients are seen on a first come, first serve basis. °  ° °Medicaid-accepting [place] Providers: ° °Organization         Address  Phone   Notes  °[facility] [address], [place] (336) 641-2100 Also accepts self-pay patients.  °[facility] [address], [place] ° (336) 856-9996   °[facility] [address], [place]  (336) 288-8857   °[facility] [address] [place] [address], [place] (336) 299-7000   °Veita Bland [address], [place]  ° (336) 373-1557 Only accepts [place] Access Medicaid patients after they have their name applied to their card.  ° °Self-Pay (no insurance) in [place]: ° °Organization         Address  Phone   Notes  °Sickle Cell Patients, [facility] [address], [place] (336) 832-1970   °[facility] [address], [place] (336) 832-4400   °[facility] ° [address], [place] (336) 992-4800   °[facility]/Dr. Osei-Bonsu ° [address] [place] [address], [place] or [address], [place] (336) [phone] Phone number for both [place] and [place] locations is the same.  °[facility] [address], [place] (336) 299-0000   °[facility] [address] [place] [address], [place] or [address] (336) 852-7530 °(336) 878-2260   °[facility] [address], [place] (336) 350-1642, phone; (336) 294-5005, fax Sees patients 1st and 3rd Saturday of every month.  Must not qualify for public or private insurance (i.e. Medicaid, Medicare, [place] Health Choice, Veterans' Benefits) • Household income should be no more than 200% of the poverty level •The clinic cannot treat you if you are pregnant or think you are pregnant • Sexually transmitted diseases are not treated at the clinic.  ° ° °Dental Care: °Organization         Address  Phone  Notes  °[place] [facility] [address], [place] (336) 641-6152 Accepts children up to age 21 who are enrolled in Medicaid or [place] Health Choice; pregnant women with a Medicaid card; and children who have applied for Medicaid or [place] Health Choice, but were declined, whose parents can pay a reduced fee at time of service.  °[place]  [facility] [place]  [address], [place] (336) 641-7733 Accepts children up to age 21 who are enrolled in Medicaid or [place] Health Choice; pregnant women with a Medicaid card; and children who have applied for Medicaid or [place] Health Choice, but were declined, whose parents can pay a reduced fee at time of service.  °[facility] ° [address],  (336) 641-4533 Patients are seen by appointment only. Walk-ins are not accepted. [facility] will see patients 18 years of age and older. °Monday - Tuesday (8am-5pm) °Most Wednesdays (8:30-5pm) °$30 per visit, cash only  °[facility] ° [address], [place] (336) 641-4533 Patients are seen by appointment only. Walk-ins are not accepted. [facility] will see patients 18 years of age and older. °One   Wednesday Evening (Monthly: Volunteer Based).  $30 per visit, cash only  °[facility]  (919) 537-3737 for adults; Children under age 4, call Graduate Pediatric Dentistry at ([phone]) [phone]. Children aged 4-14, please call (919) [phone] to request a pediatric application. ° Dental services are provided in all areas of dental care including fillings, crowns and bridges, complete and partial dentures, implants, gum treatment, root canals, and extractions. Preventive care is also provided. Treatment is provided to both adults and children. °Patients are selected via a lottery and there is often a waiting list. °  °[facility] [address], °[place] ° (336) 763-8833 [email or web address] °  °[facility] [address], [place], [place] (336)723-1848, Ext. 123 Second and Fourth Thursday of each month, opens at 6:30 AM; Clinic ends at 9 AM.  Patients are seen on a first-come first-served basis, and a limited number are seen during each clinic.  ° °[facility] ° [address], [place], [place] (336) 723-7904    Eligibility Requirements °You must have lived in [place], [place], or [place] counties for at least the last three months. °  You cannot be eligible for state or federal sponsored healthcare insurance, including Veterans Administration, Medicaid, or Medicare. °  You generally cannot be eligible for healthcare insurance through your employer.  °  How to apply: °Eligibility screenings are held every Tuesday and Wednesday afternoon from 1:00 pm until 4:00 pm. You do not need an appointment for the interview!  °[facility] [address], [place], [place] 336-631-2330   °[facility]  336-342-8273   °[facility]  336-703-3100   °[facility]  336-570-6415   ° °Behavioral Health Resources in the Community: °Intensive Outpatient Programs °Organization         Address  Phone  Notes  °[facility] [address], [place], [place] 336-878-6098   °[facility] [address], [place], [place] 336-832-9800   °[facility] [address], [place], [place] ° 336-882-2125   °[facility] [address],  °[place], [place] 1-800-853-5163 or 336-641-4981   °Substance Abuse Resources °Organization         Address  Phone  Notes  °[facility]  336-882-2125   °[facility]  336-784-9470   °[facility]  336-285-9073   °[facility]  336-845-3988   °Residential & Outpatient Substance Abuse Program  1-800-659-3381   °Psychological Services °Organization         Address  Phone  Notes  ° Health  336- 832-9600   °[facility]  336- 378-7881   °[facility] [address], [place] 1-800-853-5163 or 336-641-4981   ° °Mobile Crisis Teams °Organization         Address  Phone  Notes  °[facility], Mobile Crisis Care Unit  1-877-626-1772   °Assertive °[facility] ° [address]  [place], [place] 336-834-9664   °Sharon DeEsch [address] °[place] 336-554-5454   ° °Self-Help/Support Groups °Organization         Address  Phone             Notes  °[facility] - variety of support groups  336- 373-1402 Call for more information  °Narcotics Anonymous (NA), [facility] [address], °[place]  2 meetings at this location  ° °  Residential Treatment Programs °Organization         Address  Phone  Notes  °[facility] [address],    °[place]  1-866-801-8205   °[facility] ° [address], [place], [place] 704-293-8524   °[facility] [address], [place] 336-845-3988 Admissions: 8am-3pm M-F  °[facility] [address]    °[place], [place] 336-841-1104   °[facility] [address], [place], [place] 336-379-7146   °[facility] [address]  °[place], [place] 336-285-9073   °[facility] - Intensive Outpatient [address], [place], [place] 336-852-3033   °[facility] [address]  °[place], [place] 1-877-615-2722 or 336-784-9470   °[facility] [address] [place], [place] 336-227-7417 Accepts Medicaid  °[facility] [address]  °[place] 1-800-659-3381 Substance Abuse/Addiction Treatment  ° °[place] Behavioral Health Resources °Organization         Address  Phone  Notes  °[facility]  (888) 581-9988   °Julie Brannon, PhD [address] [place], [place]   (336) 349-5553 or (336) 951-0000   °[facility]   [address] °[place], [place] (336) 349-4454   °[facility] [address], [place], [place] (336) 342-8316 Insurance/Medicaid/sponsorship through [facility]  °[facility] [address]                                    [place], [place] (336) 342-8316 Therapy/tele-psych/case    °[facility] [address]  ° [place], [place] (336) 349-2233    °Dr. Arfeen  (336) 349-4544   °[facility] [place]  [employer] [place] [facility] 1) [address], [place] °2) [address], [place] °3)  [address], [place] (336) 349-3220 °(336) 342-7768 ° °(336) 342-8140   °[place] Child Abuse Hotline (336) 342-1394 or (336) 342-3537 (After Hours)    ° ° °

## 2014-01-04 NOTE — ED Provider Notes (Signed)
CSN: [id number]     Arrival date & time 01/04/14  1129 History   First MD Initiated Contact with Patient 01/04/14 1155     Chief Complaint  Patient presents with  . SEXUALLY TRANSMITTED DISEASE     (Consider location/radiation/quality/duration/timing/severity/associated sxs/prior Treatment) HPI Comments: Patient is a 23 year old male with history of gonorrhea who presents to the emergency department today for evaluation of penile discharge. He reports that he has associated burning at the tip of his penis. Pain is worse when he urinates. This has been ongoing for the past 3 days. He states his symptoms have been gradually worsening. He reports this feels the same as his gonorrhea. He denies any fever, chills, nausea, vomiting, diarrhea, abdominal pain, penile pain, testicular pain. He denies any new sexual partners.  The history is provided by the patient. No language interpreter was used.    History reviewed. No pertinent past medical history. History reviewed. No pertinent past surgical history. No family history on file. History  Substance Use Topics  . Smoking status: Former Games developermoker  . Smokeless tobacco: Not on file  . Alcohol Use: No    Review of Systems  Constitutional: Negative for fever and chills.  Respiratory: Negative for shortness of breath.   Cardiovascular: Negative for chest pain.  Gastrointestinal: Negative for nausea, vomiting and abdominal pain.  Genitourinary: Positive for dysuria and discharge. Negative for urgency, penile swelling, scrotal swelling, penile pain and testicular pain.  All other systems reviewed and are negative.     Allergies  Review of patient's allergies indicates no known allergies.  Home Medications   Prior to Admission medications   Medication Sig Start Date End Date Taking? Authorizing Provider  cephALEXin (KEFLEX) 500 MG capsule Take 1 capsule (500 mg total) by mouth 3 (three) times daily. 11/27/13   Courtney A Forcucci, PA-C   HYDROcodone-acetaminophen (NORCO) 5-325 MG per tablet Take 1-2 tablets by mouth every 6 (six) hours as needed for severe pain. 11/27/13   Mercedes Strupp Camprubi-Soms, PA-C  HYDROcodone-acetaminophen (NORCO/VICODIN) 5-325 MG per tablet Take 1 tablet by mouth every 6 (six) hours as needed for moderate pain or severe pain. 11/27/13   Courtney A Forcucci, PA-C   BP 122/66  Pulse 66  Temp(Src) 98.3 F (36.8 C) (Oral)  Resp 22 Physical Exam  Nursing note and vitals reviewed. Constitutional: He is oriented to person, place, and time. He appears well-developed and well-nourished. No distress.  HENT:  Head: Normocephalic and atraumatic.  Right Ear: External ear normal.  Left Ear: External ear normal.  Nose: Nose normal.  Eyes: Conjunctivae are normal.  Neck: Normal range of motion. No tracheal deviation present.  Cardiovascular: Normal rate, regular rhythm and normal heart sounds.   Pulmonary/Chest: Effort normal and breath sounds normal. No stridor.  Abdominal: Soft. He exhibits no distension. There is no tenderness.  Genitourinary: Testes normal and penis normal. Right testis shows no mass, no swelling and no tenderness. Left testis shows no mass, no swelling and no tenderness. Circumcised. No hypospadias, penile erythema or penile tenderness. No discharge found.  No penile discharge on my exam  Musculoskeletal: Normal range of motion.  Neurological: He is alert and oriented to person, place, and time.  Skin: Skin is warm and dry. He is not diaphoretic.  Psychiatric: He has a normal mood and affect. His behavior is normal.    ED Course  Procedures (including critical care time) Labs Review Labs Reviewed  GC/CHLAMYDIA PROBE AMP  HIV ANTIBODY (ROUTINE TESTING)  RPR  Imaging Review No results found.   EKG Interpretation None      MDM   Final diagnoses:  Penile discharge    Patient presents emergency department for evaluation of penile discharge. Will treat for gonorrhea  and Chlamydia with azithromycin and Rocephin in the emergency department. Discussed with patient that testing for gonorrhea, Chlamydia, HIV, syphilis will not come back today and he will receive a phone call if any of these tests are positive. Encouraged to abstain from sexual activity for one week. Followup at the health Department. Discussed reasons to return to ED immediately. Vital signs stable for discharge. Patient / Family / Caregiver informed of clinical course, understand medical decision-making process, and agree with plan.     Mora BellmanHannah S Gillian Meeuwsen, PA-C 01/04/14 [phone]

## 2014-01-04 NOTE — ED Notes (Signed)
Pt reports penile discharge and burning at tip of penis x 3 day. Reports hx of gonorrhea, states this feels the same. Pt in NAD.

## 2014-01-05 LAB — GC/CHLAMYDIA PROBE AMP
CT Probe RNA: NEGATIVE
GC PROBE AMP APTIMA: POSITIVE — AB

## 2014-01-05 NOTE — ED Provider Notes (Signed)
Medical screening examination/treatment/procedure(s) were performed by non-physician practitioner and as supervising physician I was immediately available for consultation/collaboration.   EKG Interpretation None        Richardean Canalavid H Abbie Jablon, MD 01/05/14 Nicholos [id number]

## 2014-01-06 ENCOUNTER — Telehealth (HOSPITAL_BASED_OUTPATIENT_CLINIC_OR_DEPARTMENT_OTHER): Payer: Self-pay | Admitting: Emergency Medicine

## 2014-01-06 NOTE — Telephone Encounter (Signed)
Positive Gonorrhea culture Treated with Rocephin and Zithromax per protocol MD [facility] faxed   pt notified of positive gonorrhea and that treatment was given while in ED with Rocephin and Zithromax. STD instructions provided, pt verbalized understanding.

## 2014-09-08 ENCOUNTER — Encounter (HOSPITAL_COMMUNITY): Payer: Self-pay | Admitting: Emergency Medicine

## 2014-09-08 ENCOUNTER — Emergency Department (INDEPENDENT_AMBULATORY_CARE_PROVIDER_SITE_OTHER)
Admission: EM | Admit: 2014-09-08 | Discharge: 2014-09-08 | Disposition: A | Discharge status: Home / Self Care | Payer: Self-pay | Source: Home / Self Care | Attending: Emergency Medicine | Admitting: Emergency Medicine

## 2014-09-08 DIAGNOSIS — T23202A Burn of second degree of left hand, unspecified site, initial encounter: Secondary | ICD-10-CM

## 2014-09-08 MED ORDER — SILVER SULFADIAZINE 1 % EX CREA
TOPICAL_CREAM | CUTANEOUS | Status: DC
Start: 2014-09-08 — End: 2021-11-14

## 2014-09-08 NOTE — ED Notes (Signed)
Pt reports he sustained a 2nd degree burn to left hand at work yest Reports hot [place] landed on his hand Blister popped yest Alert, no signs of acute distress.

## 2014-09-08 NOTE — ED Provider Notes (Signed)
CSN: [id number]     Arrival date & time 09/08/14  1722 History   First MD Initiated Contact with Patient 09/08/14 1736     Chief Complaint  Patient presents with  . Hand Burn   (Consider location/radiation/quality/duration/timing/severity/associated sxs/prior Treatment) HPI  He is a 24 year old man here for evaluation of burn. He states a bladder of hot grease burned his left hand 3 days ago. It blistered up. The blister popped yesterday. He has applied over-the-counter antibiotics as well as an over-the-counter silver-containing medication. He denies any severe pain currently. He is able to move all of his fingers.  History reviewed. No pertinent past medical history. History reviewed. No pertinent past surgical history. No family history on file. History  Substance Use Topics  . Smoking status: Former Games developer  . Smokeless tobacco: Not on file  . Alcohol Use: No    Review of Systems As in history of present illness Allergies  Review of patient's allergies indicates no known allergies.  Home Medications   Prior to Admission medications   Medication Sig Start Date End Date Taking? Authorizing Provider  silver sulfADIAZINE (SILVADENE) 1 % cream Apply to burn twice a day until healed. 09/08/14   Charm Rings, MD   BP 117/73 mmHg  Pulse 76  Temp(Src) 98.7 F (37.1 C) (Oral)  Resp 12  SpO2 100% Physical Exam  Constitutional: He is oriented to person, place, and time. He appears well-developed and well-nourished. No distress.  Cardiovascular: Normal rate.   Pulmonary/Chest: Effort normal.  Neurological: He is alert and oriented to person, place, and time.  Skin:  2 cm superficial ulceration to the left dorsal hand in the first web space. Overlying blister has been removed. No surrounding erythema or drainage. He has full range of motion of his left hand.    ED Course  Procedures (including critical care time) Labs Review Labs Reviewed - No data to display  Imaging  Review No results found.   MDM   1. Burn of left hand, second degree, initial encounter    Wound care with Silvadene cream. Follow-up as needed.    Charm Rings, MD 09/08/14 1758

## 2014-09-08 NOTE — ED Notes (Signed)
Applied silvadene and put 2x2 telfa; secured w/3 in coban.

## 2014-09-08 NOTE — Discharge Instructions (Signed)
Burn Care °Burns hurt your skin. When your skin is hurt, it is easier to get an infection. Follow your doctor's directions to help prevent an infection. °HOME CARE °· Wash your hands well before you change your bandage. °· Change your bandage as often as told by your doctor. °¨ Remove the old bandage. If the bandage sticks, soak it off with cool, clean water. °¨ Gently clean the burn with mild soap and water. °¨ Pat the burn dry with a clean, dry cloth. °¨ Put a thin layer of medicated cream on the burn. °¨ Put a clean bandage on as told by your doctor. °¨ Keep the bandage clean and dry. °· Raise (elevate) the burn for the first 24 hours. After that, follow your doctor's directions. °· Only take medicine as told by your doctor. °GET HELP RIGHT AWAY IF:  °· You have too much pain. °· The skin near the burn is red, tender, puffy (swollen), or has red streaks. °· The burn area has yellowish white fluid (pus) or a bad smell coming from it. °· You have a fever. °MAKE SURE YOU:  °· Understand these instructions. °· Will watch your condition. °· Will get help right away if you are not doing well or get worse. °Document Released: 12/17/2007 Document Revised: 06/01/2011 Document Reviewed: 07/30/2010 °ExitCare® Patient Information ©2015 [employer]. This information is not intended to replace advice given to you by your health care provider. Make sure you discuss any questions you have with your health care provider. ° °

## 2015-03-05 ENCOUNTER — Encounter (HOSPITAL_COMMUNITY): Payer: Self-pay

## 2015-03-05 ENCOUNTER — Emergency Department (HOSPITAL_COMMUNITY)
Admission: EM | Admit: 2015-03-05 | Discharge: 2015-03-05 | Disposition: A | Discharge status: Home / Self Care | Payer: Self-pay | Attending: Emergency Medicine | Admitting: Emergency Medicine

## 2015-03-05 DIAGNOSIS — N390 Urinary tract infection, site not specified: Secondary | ICD-10-CM

## 2015-03-05 DIAGNOSIS — Z202 Contact with and (suspected) exposure to infections with a predominantly sexual mode of transmission: Secondary | ICD-10-CM

## 2015-03-05 DIAGNOSIS — Z87891 Personal history of nicotine dependence: Secondary | ICD-10-CM | POA: Insufficient documentation

## 2015-03-05 LAB — URINALYSIS, ROUTINE W REFLEX MICROSCOPIC
Bilirubin Urine: NEGATIVE
Glucose, UA: NEGATIVE mg/dL
Hgb urine dipstick: NEGATIVE
Ketones, ur: NEGATIVE mg/dL
Nitrite: NEGATIVE
Protein, ur: NEGATIVE mg/dL
Specific Gravity, Urine: 1.028 (ref 1.005–1.030)
pH: 7 (ref 5.0–8.0)

## 2015-03-05 LAB — URINE MICROSCOPIC-ADD ON
Bacteria, UA: NONE SEEN
Squamous Epithelial / HPF: NONE SEEN

## 2015-03-05 MED ORDER — CEFTRIAXONE SODIUM 250 MG IJ SOLR
250.0000 mg | Freq: Once | INTRAMUSCULAR | Status: AC
  Administered 2015-03-05: 250 mg via INTRAMUSCULAR
  Filled 2015-03-05: qty 250

## 2015-03-05 MED ORDER — LIDOCAINE HCL (PF) 1 % IJ SOLN
INTRAMUSCULAR | Status: AC
  Administered 2015-03-05: 5 mL
  Filled 2015-03-05: qty 5

## 2015-03-05 MED ORDER — AZITHROMYCIN 250 MG PO TABS
1000.0000 mg | ORAL_TABLET | Freq: Once | ORAL | Status: AC
  Administered 2015-03-05: 1000 mg via ORAL
  Filled 2015-03-05: qty 4

## 2015-03-05 MED ORDER — CIPROFLOXACIN HCL 250 MG PO TABS: 250.0000 mg | ORAL_TABLET | Freq: Two times a day (BID) | ORAL | Status: DC

## 2015-03-05 NOTE — ED Notes (Signed)
Pt stable, ambulatory, states understanding of discharge instructions 

## 2015-03-05 NOTE — ED Provider Notes (Signed)
CSN: [id number]     Arrival date & time 03/05/15  1500 History  By signing my name below, I, Jorge Williamson, attest that this documentation has been prepared under the direction and in the presence of Jorge Lalana Wachter, PA-C. Electronically Signed: Tanda RockersMargaux Williamson, ED Scribe. 03/05/2015. 3:40 PM.   Chief Complaint  Patient presents with  . Penile Discharge   The history is provided by the patient. No language interpreter was used.     HPI Comments: Jorge Williamson is a 24 y.o. male who presents to the Emergency Department complaining of gradual onset, intermittent, mild, white penile discharge and dysuria x 2 days. He is not concerned for exposure to STDs but reports being sexually active with 3 different partners within the past month. Denies hematuria, fever, chills, abdominal pain, nausea, vomiting, pain with intercourse, or any other associated symptoms.   History reviewed. No pertinent past medical history. History reviewed. No pertinent past surgical history. History reviewed. No pertinent family history. Social History  Substance Use Topics  . Smoking status: Former Games developermoker  . Smokeless tobacco: None  . Alcohol Use: No    Review of Systems  All other systems reviewed and are negative.   Allergies  Review of patient's allergies indicates no known allergies.  Home Medications   Prior to Admission medications   Medication Sig Start Date End Date Taking? Authorizing Provider  silver sulfADIAZINE (SILVADENE) 1 % cream Apply to burn twice a day until healed. 09/08/14   Charm RingsErin J Honig, MD   Triage Vitals: BP 115/60 mmHg  Pulse 62  Temp(Src) 97.9 F (36.6 C) (Oral)  Resp 16  Ht 5\' 11"  (1.803 m)  Wt 145 lb (65.772 kg)  BMI 20.23 kg/m2  SpO2 100%   Physical Exam  Constitutional: He is oriented to person, place, and time. He appears well-developed and well-nourished. No distress.  HENT:  Head: Normocephalic and atraumatic.  Mouth/Throat: Oropharynx is clear and moist.   Eyes: Conjunctivae are normal. Right eye exhibits no discharge. Left eye exhibits no discharge. No scleral icterus.  Cardiovascular: Normal rate.   Pulmonary/Chest: Effort normal.  Abdominal: Soft. He exhibits no distension and no mass. There is no tenderness. There is no rebound and no guarding.  Genitourinary: Penis normal. No penile tenderness.  No visible penile discharge.  Neurological: He is alert and oriented to person, place, and time. Coordination normal.  Skin: Skin is warm and dry. No rash noted. He is not diaphoretic. No erythema. No pallor.  Psychiatric: He has a normal mood and affect. His behavior is normal.  Nursing note and vitals reviewed.   ED Course  Procedures (including critical care time)  DIAGNOSTIC STUDIES: Oxygen Saturation is 100% on RA, normal by my interpretation.    COORDINATION OF CARE: 3:35 PM-Discussed treatment plan which includes Zithromax and Rocephin injection with pt at bedside and pt agreed to plan.   Labs Review Labs Reviewed  URINALYSIS, ROUTINE W REFLEX MICROSCOPIC (NOT [facility]) - Abnormal; Notable for the following:    APPearance CLOUDY (*)    Leukocytes, UA MODERATE (*)    All other components within normal limits  GC/CHLAMYDIA PROBE AMP ([place]) NOT AT [facility] - Abnormal; Notable for the following:    Neisseria gonorrhea **POSITIVE** (*)    All other components within normal limits  URINE CULTURE  URINE MICROSCOPIC-ADD ON    Imaging Review No results found. I have personally reviewed and evaluated these lab results as part of my medical decision-making.   EKG  Interpretation None      MDM   Final diagnoses:  STD exposure  UTI (lower urinary tract infection)   Patient treated in the ED for STI with Azithromycin and Ceftriaxone. Patient advised to inform and treat all sexual partners.  Pt advised on safe sex practices and understands that they have GC/Chlamydia cultures pending and will result in 2-3 days.  Pt encouraged  to follow up at local health department for future STI checks. No concern for prostatitis or epididymitis. Discussed return precautions. Pt appears safe for discharge.    I personally performed the services described in this documentation, which was scribed in my presence. The recorded information has been reviewed and is accurate.       Jorge Kinsman Bison, PA-C 03/07/15 1321  Jorge Ohara, MD 03/09/15 [phone]

## 2015-03-05 NOTE — ED Notes (Signed)
Pt c/o penile discharge and urinary frequency since Saturday.  Pt states possible STD exposure from the weekend before.

## 2015-03-05 NOTE — Discharge Instructions (Signed)
Dysuria Dysuria is pain or discomfort while urinating. The pain or discomfort may be felt in the tube that carries urine out of the bladder (urethra) or in the surrounding tissue of the genitals. The pain may also be felt in the groin area, lower abdomen, and lower back. You may have to urinate frequently or have the sudden feeling that you have to urinate (urgency). Dysuria can affect both men and women, but is more common in women. Dysuria can be caused by many different things, including:  Urinary tract infection in women.  Infection of the kidney or bladder.  Kidney stones or bladder stones.  Certain sexually transmitted infections (STIs), such as chlamydia.  Dehydration.  Inflammation of the vagina.  Use of certain medicines.  Use of certain soaps or scented products that cause irritation. HOME CARE INSTRUCTIONS Watch your dysuria for any changes. The following actions may help to reduce any discomfort you are feeling:  Drink enough fluid to keep your urine clear or pale yellow.  Empty your bladder often. Avoid holding urine for long periods of time.  After a bowel movement or urination, women should cleanse from front to back, using each tissue only once.  Empty your bladder after sexual intercourse.  Take medicines only as directed by your health care provider.  If you were prescribed an antibiotic medicine, finish it all even if you start to feel better.  Avoid caffeine, tea, and alcohol. They can irritate the bladder and make dysuria worse. In men, alcohol may irritate the prostate.  Keep all follow-up visits as directed by your health care provider. This is important.  If you had any tests done to find the cause of dysuria, it is your responsibility to obtain your test results. Ask the lab or department performing the test when and how you will get your results. Talk with your health care provider if you have any questions about your results. SEEK MEDICAL CARE  IF:  You develop pain in your back or sides.  You have a fever.  You have nausea or vomiting.  You have blood in your urine.  You are not urinating as often as you usually do. SEEK IMMEDIATE MEDICAL CARE IF:  You pain is severe and not relieved with medicines.  You are unable to hold down any fluids.  You or someone else notices a change in your mental function.  You have a rapid heartbeat at rest.  You have shaking or chills.  You feel extremely weak.   This information is not intended to replace advice given to you by your health care provider. Make sure you discuss any questions you have with your health care provider.   Document Released: 12/06/2003 Document Revised: 03/30/2014 Document Reviewed: 11/02/2013 Elsevier Interactive Patient Education [employer].  Sexually Transmitted Disease A sexually transmitted disease (STD) is a disease or infection often passed to another person during sex. However, STDs can be passed through nonsexual ways. An STD can be passed through:  Spit (saliva).  Semen.  Blood.  Mucus from the vagina.  Pee (urine). HOW CAN I LESSEN MY CHANCES OF GETTING AN STD?  Use:  Latex condoms.  Water-soluble lubricants with condoms. Do not use petroleum jelly or oils.  Dental dams. These are small pieces of latex that are used as a barrier during oral sex.  Avoid having more than one sex partner.  Do not have sex with someone who has other sex partners.  Do not have sex with anyone you do  not know or who is at high risk for an STD.  Avoid risky sex that can break your skin.  Do not have sex if you have open sores on your mouth or skin.  Avoid drinking too much alcohol or taking illegal drugs. Alcohol and drugs can affect your good judgment.  Avoid oral and anal sex acts.  Get shots (vaccines) for HPV and hepatitis.  If you are at risk of being infected with HIV, it is advised that you take a certain medicine daily to  prevent HIV infection. This is called pre-exposure prophylaxis (PrEP). You may be at risk if:  You are a man who has sex with other men (MSM).  You are attracted to the opposite sex (heterosexual) and are having sex with more than one partner.  You take drugs with a needle.  You have sex with someone who has HIV.  Talk with your doctor about if you are at high risk of being infected with HIV. If you begin to take PrEP, get tested for HIV first. Get tested every 3 months for as long as you are taking PrEP.  Get tested for STDs every year if you are sexually active. If you are treated for an STD, get tested again 3 months after you are treated. WHAT SHOULD I DO IF I THINK I HAVE AN STD?  See your doctor.  Tell your sex partner(s) that you have an STD. They should be tested and treated.  Do not have sex until your doctor says it is okay. WHEN SHOULD I GET HELP? Get help right away if:  You have bad belly (abdominal) pain.  You are a man and have puffiness (swelling) or pain in your testicles.  You are a woman and have puffiness in your vagina.   This information is not intended to replace advice given to you by your health care provider. Make sure you discuss any questions you have with your health care provider.   Document Released: 04/16/2004 Document Revised: 03/30/2014 Document Reviewed: 09/02/2012 Elsevier Interactive Patient Education 2016 Elsevier Inc.  Urinary Tract Infection A urinary tract infection (UTI) can occur any place along the urinary tract. The tract includes the kidneys, ureters, bladder, and urethra. A type of germ called bacteria often causes a UTI. UTIs are often helped with antibiotic medicine.  HOME CARE   If given, take antibiotics as told by your doctor. Finish them even if you start to feel better.  Drink enough fluids to keep your pee (urine) clear or pale yellow.  Avoid tea, drinks with caffeine, and bubbly (carbonated) drinks.  Pee often. Avoid  holding your pee in for a long time.  Pee before and after having sex (intercourse).  Wipe from front to back after you poop (bowel movement) if you are a woman. Use each tissue only once. GET HELP RIGHT AWAY IF:   You have back pain.  You have lower belly (abdominal) pain.  You have chills.  You feel sick to your stomach (nauseous).  You throw up (vomit).  Your burning or discomfort with peeing does not go away.  You have a fever.  Your symptoms are not better in 3 days. MAKE SURE YOU:   Understand these instructions.  Will watch your condition.  Will get help right away if you are not doing well or get worse.   This information is not intended to replace advice given to you by your health care provider. Make sure you discuss any questions you have  with your health care provider.   If Your gonorrhea and chlamydia cultures come back positive, he will need to inform all sexual partners to be treated as well. Encourage safe sex practices in the future. Return to the emergency department if you expands worsening of her symptoms, fever, abdominal pain, blood in your urine, vomiting, continued penile discharge.

## 2015-03-06 LAB — URINE CULTURE: Culture: 2000

## 2015-03-07 LAB — GC/CHLAMYDIA PROBE AMP (~~LOC~~) NOT AT ARMC
Chlamydia: NEGATIVE
Neisseria Gonorrhea: POSITIVE — AB

## 2015-03-08 ENCOUNTER — Telehealth (HOSPITAL_COMMUNITY): Payer: Self-pay

## 2015-03-08 NOTE — Telephone Encounter (Signed)
[facility].  (+) gonorrhea.  Treated with Zithromax and Rocephin.   [facility] form completed and faxed.    03/08/15 @11 :03 LVM requesting callback.

## 2015-03-09 ENCOUNTER — Telehealth (HOSPITAL_COMMUNITY): Payer: Self-pay

## 2015-03-09 NOTE — Telephone Encounter (Signed)
Unable to reach by telephone. Letter sent to address on record.  

## 2015-03-26 ENCOUNTER — Telehealth (HOSPITAL_COMMUNITY): Payer: Self-pay

## 2015-03-26 NOTE — Telephone Encounter (Signed)
Unable to reach by phone or mail.  Chart closed.   

## 2015-05-09 ENCOUNTER — Telehealth (HOSPITAL_BASED_OUTPATIENT_CLINIC_OR_DEPARTMENT_OTHER): Payer: Self-pay | Admitting: Emergency Medicine

## 2015-06-18 ENCOUNTER — Emergency Department (INDEPENDENT_AMBULATORY_CARE_PROVIDER_SITE_OTHER)
Admission: EM | Admit: 2015-06-18 | Discharge: 2015-06-18 | Disposition: A | Discharge status: Home / Self Care | Payer: Self-pay | Source: Home / Self Care | Attending: Family Medicine | Admitting: Family Medicine

## 2015-06-18 ENCOUNTER — Encounter (HOSPITAL_COMMUNITY): Payer: Self-pay | Admitting: Emergency Medicine

## 2015-06-18 DIAGNOSIS — J111 Influenza due to unidentified influenza virus with other respiratory manifestations: Secondary | ICD-10-CM

## 2015-06-18 DIAGNOSIS — R69 Illness, unspecified: Principal | ICD-10-CM

## 2015-06-18 MED ORDER — ACETAMINOPHEN 160 MG/5ML PO SOLN
ORAL | Status: AC
  Filled 2015-06-18: qty 20.3

## 2015-06-18 MED ORDER — GUAIFENESIN 100 MG/5ML PO LIQD: 200.0000 mg | Freq: Three times a day (TID) | ORAL | Status: DC | PRN

## 2015-06-18 MED ORDER — ACETAMINOPHEN 325 MG PO TABS
650.0000 mg | ORAL_TABLET | Freq: Once | ORAL | Status: AC
  Administered 2015-06-18: 650 mg via ORAL

## 2015-06-18 NOTE — Discharge Instructions (Signed)
It is a pleasure to see you today.  You have an influenza-like illness ('the flu').  I am giving you a prescription for GUAIFENESIN suspension to help loosen phlegm in your chest.    Take plenty of liquids.   For the body aches, you may take Tylenol or Ibuprofen suspension as needed.   Please return to the urgent care center if you do not improve, if you feel worse, if you have shortness of breath, or with other concerns.

## 2015-06-18 NOTE — ED Notes (Signed)
Reports fever, cough, stuffy nose, runny nose, headache and body aches.

## 2015-06-18 NOTE — ED Provider Notes (Signed)
CSN: [id number]     Arrival date & time 06/18/15  1933 History   First MD Initiated Contact with Patient 06/18/15 2140     Chief Complaint  Patient presents with  . URI   (Consider location/radiation/quality/duration/timing/severity/associated sxs/prior Treatment) Patient is a 25 y.o. male presenting with URI. The history is provided by the patient. No language interpreter was used.  URI Patient with complaint of 2-3 days of fevers, cough, nasal congestion and headache. Onset began with sudden onset headache; achy and body pains.  Last night he felt sick to his stomach and vomited twice, felt better afterward. Watery rhinorrhea.  COugh with scant sputum.   Nonsmoker.  Cares for his mother in rehab facility, she has SLE.   History reviewed. No pertinent past medical history. History reviewed. No pertinent past surgical history. No family history on file. Social History  Substance Use Topics  . Smoking status: Former Games developermoker  . Smokeless tobacco: None  . Alcohol Use: No    Review of Systems  Allergies  Review of patient's allergies indicates no known allergies.  Home Medications   Prior to Admission medications   Medication Sig Start Date End Date Taking? Authorizing Provider  ciprofloxacin (CIPRO) 250 MG tablet Take 1 tablet (250 mg total) by mouth 2 (two) times daily. Patient not taking: Reported on 06/18/2015 03/05/15   Samantha Tripp Dowless, PA-C  guaiFENesin (ROBITUSSIN) 100 MG/5ML liquid Take 10 mLs (200 mg total) by mouth 3 (three) times daily as needed for cough. 06/18/15   Barbaraann BarthelJames O Hajer Dwyer, MD  guaiFENesin (ROBITUSSIN) 100 MG/5ML liquid Take 10 mLs (200 mg total) by mouth 3 (three) times daily as needed for cough. 06/18/15   Barbaraann BarthelJames O Mabel Unrein, MD  silver sulfADIAZINE (SILVADENE) 1 % cream Apply to burn twice a day until healed. Patient not taking: Reported on 06/18/2015 09/08/14   Charm RingsErin J Honig, MD   Meds Ordered and Administered this Visit   Medications  acetaminophen (TYLENOL)  tablet 650 mg (650 mg Oral Given 06/18/15 2125)    BP 108/65 mmHg  Pulse 109  Temp(Src) 102.2 F (39 C) (Oral)  Resp 16  SpO2 98% No data found.   Physical Exam  Constitutional: He appears well-developed and well-nourished. No distress.  HENT:  Head: Normocephalic.  Right Ear: External ear normal.  Left Ear: External ear normal.  Nose: Nose normal.  Mouth/Throat: Oropharynx is clear and moist. No oropharyngeal exudate.  Watery nasal discharge  Eyes: Conjunctivae are normal. Right eye exhibits no discharge. Left eye exhibits no discharge.  Neck: Normal range of motion. Neck supple. No thyromegaly present.  Cardiovascular: Normal rate, regular rhythm and normal heart sounds.   Pulmonary/Chest: Effort normal and breath sounds normal. No respiratory distress. He has no wheezes. He has no rales. He exhibits no tenderness.  No increased work of breathing  Abdominal: Soft. He exhibits no distension and no mass. There is no tenderness. There is no rebound and no guarding.  Lymphadenopathy:    He has no cervical adenopathy.  Skin: He is diaphoretic.    ED Course  Procedures (including critical care time)  Labs Review Labs Reviewed - No data to display  Imaging Review No results found.   Visual Acuity Review  Right Eye Distance:   Left Eye Distance:   Bilateral Distance:    Right Eye Near:   Left Eye Near:    Bilateral Near:         MDM   1. Influenza-like illness    ILI,  supportive care. Discussed reasons for return to Urgent [facility].    Paula Compton, MD    Barbaraann Barthel, MD 06/18/15 [phone]

## 2015-09-08 ENCOUNTER — Emergency Department (HOSPITAL_COMMUNITY)
Admission: EM | Admit: 2015-09-08 | Discharge: 2015-09-08 | Disposition: A | Discharge status: Home / Self Care | Payer: Self-pay | Attending: Emergency Medicine | Admitting: Emergency Medicine

## 2015-09-08 ENCOUNTER — Encounter (HOSPITAL_COMMUNITY): Payer: Self-pay

## 2015-09-08 DIAGNOSIS — Z87891 Personal history of nicotine dependence: Secondary | ICD-10-CM | POA: Insufficient documentation

## 2015-09-08 DIAGNOSIS — H109 Unspecified conjunctivitis: Secondary | ICD-10-CM | POA: Insufficient documentation

## 2015-09-08 MED ORDER — FLUORESCEIN SODIUM 1 MG OP STRP
1.0000 | ORAL_STRIP | Freq: Once | OPHTHALMIC | Status: AC
  Administered 2015-09-08: 1 via OPHTHALMIC
  Filled 2015-09-08: qty 1

## 2015-09-08 MED ORDER — TETRACAINE HCL 0.5 % OP SOLN
1.0000 [drp] | Freq: Once | OPHTHALMIC | Status: AC
  Administered 2015-09-08: 1 [drp] via OPHTHALMIC
  Filled 2015-09-08: qty 2

## 2015-09-08 MED ORDER — POLYMYXIN B-TRIMETHOPRIM 10000-0.1 UNIT/ML-% OP SOLN: 1.0000 [drp] | OPHTHALMIC | Status: DC

## 2015-09-08 NOTE — ED Provider Notes (Signed)
CSN: [id number]     Arrival date & time 09/08/15  2226 History  By signing my name below, I, Soijett Blue, attest that this documentation has been prepared under the direction and in the presence of Roxy Horsemanobert Marji Kuehnel, PA-C Electronically Signed: Soijett Blue, ED Scribe. 09/08/2015. 10:44 PM.   Chief Complaint  Patient presents with  . Eye Drainage      The history is provided by the patient. No language interpreter was used.    HPI Comments: Jorge Williamson is a 25 y.o. male who presents to the Emergency Department complaining of right eye drainage onset this morning. Pt reports that he woke up this morning with right eye crusting. Denies a FB in his right eye. Denies contacts with similar symptoms. He states that he is having associated symptoms of right eye redness, watery right eye, right eye itching, and mild irritation and FB sensation to right eye. He states that he has not tried any medications for the relief for his symptoms. He denies right eye pain and any other symptoms.     History reviewed. No pertinent past medical history. History reviewed. No pertinent past surgical history. History reviewed. No pertinent family history. Social History  Substance Use Topics  . Smoking status: Former Games developermoker  . Smokeless tobacco: None  . Alcohol Use: No    Review of Systems  Constitutional: Negative for fever and chills.  Eyes: Positive for discharge (right), redness (right eye) and itching (right). Negative for pain.       Right eye irritation and FB sensation  All other systems reviewed and are negative.     Allergies  Review of patient's allergies indicates no known allergies.  Home Medications   Prior to Admission medications   Medication Sig Start Date End Date Taking? Authorizing Provider  ciprofloxacin (CIPRO) 250 MG tablet Take 1 tablet (250 mg total) by mouth 2 (two) times daily. Patient not taking: Reported on 06/18/2015 03/05/15   Samantha Tripp Dowless, PA-C   guaiFENesin (ROBITUSSIN) 100 MG/5ML liquid Take 10 mLs (200 mg total) by mouth 3 (three) times daily as needed for cough. 06/18/15   Barbaraann BarthelJames O Breen, MD  guaiFENesin (ROBITUSSIN) 100 MG/5ML liquid Take 10 mLs (200 mg total) by mouth 3 (three) times daily as needed for cough. 06/18/15   Barbaraann BarthelJames O Breen, MD  silver sulfADIAZINE (SILVADENE) 1 % cream Apply to burn twice a day until healed. Patient not taking: Reported on 06/18/2015 09/08/14   Charm RingsErin J Honig, MD   BP 119/78 mmHg  Pulse 60  Temp(Src) 98.2 F (36.8 C) (Oral)  Resp 16  Ht 5\' 11"  (1.803 m)  Wt 140 lb (63.504 kg)  BMI 19.53 kg/m2  SpO2 100% Physical Exam  Constitutional: He is oriented to person, place, and time. He appears well-developed and well-nourished. No distress.  HENT:  Head: Normocephalic and atraumatic.  Eyes: EOM are normal. Pupils are equal, round, and reactive to light.  Slit lamp exam:      The right eye shows no corneal abrasion, no foreign body and no fluorescein uptake.  Mild conjunctival irritation.   Neck: Neck supple.  Cardiovascular: Normal rate.   Pulmonary/Chest: Effort normal. No respiratory distress.  Abdominal: He exhibits no distension.  Musculoskeletal: Normal range of motion.  Neurological: He is alert and oriented to person, place, and time.  Skin: Skin is warm and dry.  Psychiatric: He has a normal mood and affect. His behavior is normal.  Nursing note and vitals reviewed.   ED  Course  Procedures (including critical care time) DIAGNOSTIC STUDIES: Oxygen Saturation is 100% on RA, nl by my interpretation.    COORDINATION OF CARE: 10:42 PM Discussed treatment plan with pt at bedside and pt agreed to plan.     MDM   Final diagnoses:  Conjunctivitis of right eye    Patient with probable allergic conjunctivitis.  Doubt bacterial, but will cover with polytrim.  VSS.  No FB.  No fluorescein uptake.    DC to home. Ophthalmology f/u in 2 days if not better.  I personally performed the  services described in this documentation, which was scribed in my presence. The recorded information has been reviewed and is accurate.      Roxy Horseman, PA-C 09/08/15 2321  Lavera Guise, MD 09/09/15 0040

## 2015-09-08 NOTE — Discharge Instructions (Signed)
Allergic Conjunctivitis Allergic conjunctivitis is inflammation of the clear membrane that covers the white part of your eye and the inner surface of your eyelid (conjunctiva), and it is caused by allergies. The blood vessels in the conjunctiva become inflamed, and this causes the eye to become red or pink, and it often causes itchiness in the eye. Allergic conjunctivitis cannot be spread by one person to another person (noncontagious). CAUSES This condition is caused by an allergic reaction. Common causes of an allergic reaction (allergens) include:  Dust.  Pollen.  Mold.  Animal dander or secretions. RISK FACTORS This condition is more likely to develop if you are exposed to high levels of allergens that cause the allergic reaction. This might include being outdoors when air pollen levels are high or being around animals that you are allergic to. SYMPTOMS Symptoms of this condition may include:  Eye redness.  Tearing of the eyes.  Watery eyes.  Itchy eyes.  Burning feeling in the eyes.  Clear drainage from the eyes.  Swollen eyelids. DIAGNOSIS This condition may be diagnosed by medical history and physical exam. If you have drainage from your eyes, it may be tested to rule out other causes of conjunctivitis. TREATMENT Treatment for this condition often includes medicines. These may be eye drops, ointments, or oral medicines. They may be prescription medicines or over-the-counter medicines. HOME CARE INSTRUCTIONS  Take or apply medicines only as directed by your health care provider.  Do not touch or rub your eyes.  Do not wear contact lenses until the inflammation is gone. Wear glasses instead.  Do not wear eye makeup until the inflammation is gone.  Apply a cool, clean washcloth to your eye for 10-20 minutes, 3-4 times a day.  Try to avoid whatever allergen is causing the allergic reaction. SEEK MEDICAL CARE IF:  Your symptoms get worse.  You have pus draining  from your eye.  You have new symptoms.  You have a fever.   This information is not intended to replace advice given to you by your health care provider. Make sure you discuss any questions you have with your health care provider.   Document Released: 05/30/2002 Document Revised: 03/30/2014 Document Reviewed: 12/19/2013 Elsevier Interactive Patient Education 2016 [employer]  

## 2015-09-08 NOTE — ED Notes (Signed)
Onset this morning right eye matting, sclera red, watery, itching.  Recent cold/cough symptoms.  NO fever, no known exposure to anyone with same s/s.

## 2015-12-13 ENCOUNTER — Emergency Department (HOSPITAL_COMMUNITY)
Admission: EM | Admit: 2015-12-13 | Discharge: 2015-12-13 | Disposition: A | Discharge status: Home / Self Care | Payer: Self-pay | Attending: Dermatology | Admitting: Dermatology

## 2015-12-13 ENCOUNTER — Encounter (HOSPITAL_COMMUNITY): Payer: Self-pay | Admitting: Emergency Medicine

## 2015-12-13 DIAGNOSIS — Z5321 Procedure and treatment not carried out due to patient leaving prior to being seen by health care provider: Secondary | ICD-10-CM | POA: Insufficient documentation

## 2015-12-13 DIAGNOSIS — Z87891 Personal history of nicotine dependence: Secondary | ICD-10-CM | POA: Insufficient documentation

## 2015-12-13 DIAGNOSIS — Z202 Contact with and (suspected) exposure to infections with a predominantly sexual mode of transmission: Secondary | ICD-10-CM | POA: Insufficient documentation

## 2015-12-13 NOTE — ED Triage Notes (Signed)
Pt sts wishes to be checked for STD's because he "has been very sexually active" pt denies sx

## 2016-01-18 ENCOUNTER — Encounter (HOSPITAL_COMMUNITY): Payer: Self-pay | Admitting: Emergency Medicine

## 2016-01-18 ENCOUNTER — Emergency Department (HOSPITAL_COMMUNITY)
Admission: EM | Admit: 2016-01-18 | Discharge: 2016-01-18 | Disposition: A | Discharge status: Home / Self Care | Payer: Self-pay | Attending: Emergency Medicine | Admitting: Emergency Medicine

## 2016-01-18 DIAGNOSIS — Z87891 Personal history of nicotine dependence: Secondary | ICD-10-CM | POA: Insufficient documentation

## 2016-01-18 DIAGNOSIS — Z202 Contact with and (suspected) exposure to infections with a predominantly sexual mode of transmission: Secondary | ICD-10-CM

## 2016-01-18 LAB — URINALYSIS, ROUTINE W REFLEX MICROSCOPIC
Bilirubin Urine: NEGATIVE
Glucose, UA: NEGATIVE mg/dL
Hgb urine dipstick: NEGATIVE
Ketones, ur: NEGATIVE mg/dL
Leukocytes, UA: NEGATIVE
Nitrite: NEGATIVE
Protein, ur: NEGATIVE mg/dL
Specific Gravity, Urine: 1.024 (ref 1.005–1.030)
pH: 7.5 (ref 5.0–8.0)

## 2016-01-18 MED ORDER — CEFTRIAXONE SODIUM 250 MG IJ SOLR
250.0000 mg | Freq: Once | INTRAMUSCULAR | Status: AC
  Administered 2016-01-18: 250 mg via INTRAMUSCULAR
  Filled 2016-01-18: qty 250

## 2016-01-18 MED ORDER — LIDOCAINE HCL (PF) 1 % IJ SOLN
5.0000 mL | Freq: Once | INTRAMUSCULAR | Status: AC
  Administered 2016-01-18: 5 mL
  Filled 2016-01-18: qty 5

## 2016-01-18 MED ORDER — AZITHROMYCIN 250 MG PO TABS
1000.0000 mg | ORAL_TABLET | Freq: Once | ORAL | Status: AC
  Administered 2016-01-18: 1000 mg via ORAL
  Filled 2016-01-18: qty 4

## 2016-01-18 NOTE — ED Provider Notes (Signed)
[facility]-EMERGENCY DEPT Provider Note   By signing my name below, I, Earmon PhoenixJennifer Waddell, attest that this documentation has been prepared under the direction and in the presence of AvayaSamantha Shevelle Smither, PA-C. Electronically Signed: Earmon PhoenixJennifer Waddell, ED Scribe. 01/18/16. 1:45 PM.    History   Chief Complaint Chief Complaint  Patient presents with  . Exposure to STD    HPI  HPI Comments:  Jorge Williamson is a 25 y.o. male who presents to the Emergency Department complaining of an exposure to an STD. He states his partner was recently diagnosed with chlamydia and notified he needed to be checked two days ago. He states he has had multiple partners and does not always use protection. He denies any penile or scrotal pain or swelling, discharge, lesions, abdominal pain, nausea, vomiting, fever, chills, dysuria or any other symptoms.    History reviewed. No pertinent past medical history.  There are no active problems to display for this patient.   History reviewed. No pertinent surgical history.     Home Medications    Prior to Admission medications   Medication Sig Start Date End Date Taking? Authorizing Provider  ciprofloxacin (CIPRO) 250 MG tablet Take 1 tablet (250 mg total) by mouth 2 (two) times daily. Patient not taking: Reported on 06/18/2015 03/05/15   Jonette Wassel Tripp Delcie Ruppert, PA-C  guaiFENesin (ROBITUSSIN) 100 MG/5ML liquid Take 10 mLs (200 mg total) by mouth 3 (three) times daily as needed for cough. 06/18/15   Barbaraann BarthelJames O Breen, MD  guaiFENesin (ROBITUSSIN) 100 MG/5ML liquid Take 10 mLs (200 mg total) by mouth 3 (three) times daily as needed for cough. 06/18/15   Barbaraann BarthelJames O Breen, MD  silver sulfADIAZINE (SILVADENE) 1 % cream Apply to burn twice a day until healed. Patient not taking: Reported on 06/18/2015 09/08/14   Charm RingsErin J Honig, MD  trimethoprim-polymyxin b (POLYTRIM) ophthalmic solution Place 1 drop into both eyes every 4 (four) hours. 09/08/15   Roxy Horsemanobert Browning, PA-C    Family  History No family history on file.  Social History Social History  Substance Use Topics  . Smoking status: Former Games developermoker  . Smokeless tobacco: Former NeurosurgeonUser  . Alcohol use No     Allergies   Review of patient's allergies indicates no known allergies.   Review of Systems Review of Systems A complete 10 system review of systems was obtained and all systems are negative except as noted in the HPI and PMH.    Physical Exam Updated Vital Signs BP 119/76 (BP Location: Left Arm)   Pulse 61   Temp 98.7 F (37.1 C) (Oral)   Resp 16   Ht 5\' 11"  (1.803 m)   Wt 140 lb (63.5 kg)   SpO2 100%   BMI 19.53 kg/m   Physical Exam  Constitutional: He is oriented to person, place, and time. He appears well-developed and well-nourished. No distress.  HENT:  Head: Normocephalic and atraumatic.  Eyes: Conjunctivae are normal. Right eye exhibits no discharge. Left eye exhibits no discharge. No scleral icterus.  Cardiovascular: Normal rate.   Pulmonary/Chest: Effort normal.  Genitourinary: Testes normal and penis normal. Circumcised. No penile tenderness. No discharge found.  Neurological: He is alert and oriented to person, place, and time. Coordination normal.  Skin: Skin is warm and dry. No rash noted. He is not diaphoretic. No erythema. No pallor.  Psychiatric: He has a normal mood and affect. His behavior is normal.  Nursing note and vitals reviewed.    ED Treatments / Results  DIAGNOSTIC STUDIES:  Oxygen Saturation is 100% on RA, normal by my interpretation.   COORDINATION OF CARE: 1:43 PM- Will check and treat for GC/chlamydia. Pt verbalizes understanding and agrees to plan.  Medications  cefTRIAXone (ROCEPHIN) injection 250 mg (not administered)  azithromycin (ZITHROMAX) tablet 1,000 mg (not administered)    Labs (all labs ordered are listed, but only abnormal results are displayed) Labs Reviewed  URINALYSIS, ROUTINE W REFLEX MICROSCOPIC ([facility])  GC/CHLAMYDIA PROBE  AMP ([place]) NOT AT [facility]    EKG  EKG Interpretation None       Radiology No results found.  Procedures Procedures (including critical care time)  Medications Ordered in ED Medications  cefTRIAXone (ROCEPHIN) injection 250 mg (not administered)  azithromycin (ZITHROMAX) tablet 1,000 mg (not administered)     Initial Impression / Assessment and Plan / ED Course  I have reviewed the triage vital signs and the nursing notes.  Pertinent labs & imaging results that were available during my care of the patient were reviewed by me and considered in my medical decision making (see chart for details).  Clinical Course    Pt arrives for asymptomatic STD check. Will check and treat for GC/chlamydia due to known exposure. Discussed safe sexual practices. Pt is advised to follow up for free testing at local health department in the future. Pt appears safe for discharge.    Final Clinical Impressions(s) / ED Diagnoses   Final diagnoses:  STD exposure    New Prescriptions New Prescriptions   No medications on file     Dub MikesSamantha Tripp Ladashia Demarinis, PA-C 01/18/16 1423    Maia PlanJoshua G Long, MD 01/18/16 [phone]

## 2016-01-18 NOTE — ED Triage Notes (Signed)
Pt. Stated, his sex partner said she tested positive for chlamydia and needed to be checked, no symptoms

## 2016-01-18 NOTE — ED Notes (Signed)
Pt states he as to crush pills and take with applesauce.

## 2016-01-18 NOTE — Discharge Instructions (Signed)
You still have gonorrhea and chlamydia cultures pending. These will result in the next 2-3 days. If they are positive you'll be notified. At the time you need to notify all sexual partners may also be treated. Encourage safe sex practices. Return to the ED if you experience abdominal pain, testicular pain, vomiting, fevers.

## 2016-01-20 LAB — GC/CHLAMYDIA PROBE AMP (~~LOC~~) NOT AT ARMC
Chlamydia: POSITIVE — AB
Neisseria Gonorrhea: NEGATIVE

## 2016-01-22 NOTE — ED Notes (Signed)
Attempted to call with lab results no response , letter sent.

## 2016-06-06 IMAGING — CR DG FINGER MIDDLE 2+V*R*
3 series · 3 of 3 positions shown · non-contrast
Comparison: None.

CLINICAL DATA: Laceration to the tip of middle finger with the meet
slight her.

EXAM:
RIGHT MIDDLE FINGER 2+V

[x finger pa right]
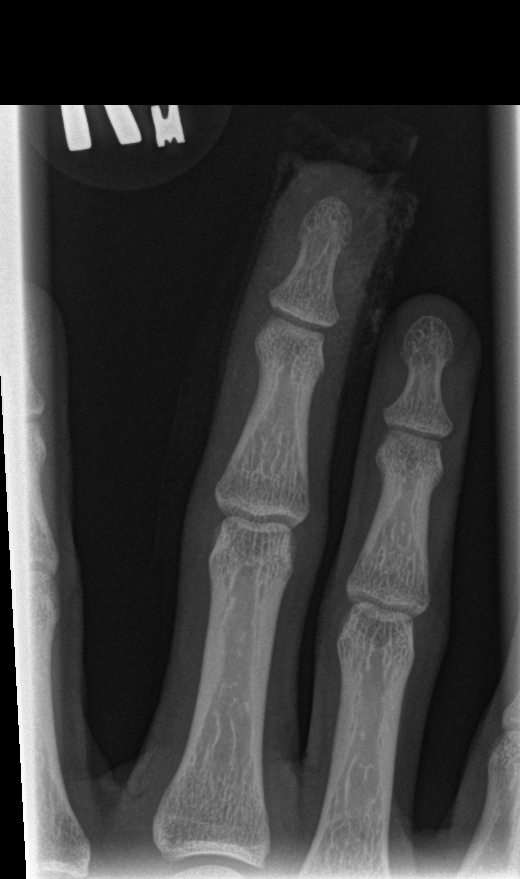

[x finger obl. right]
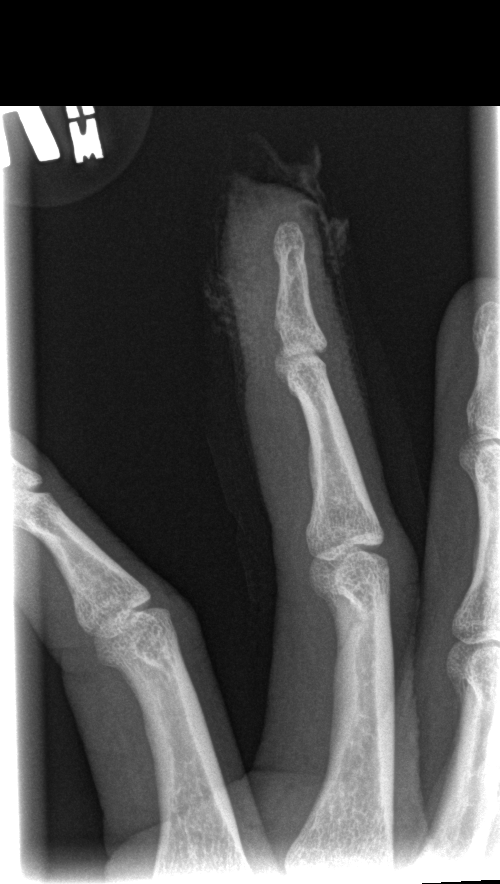

[x finger lateral right]
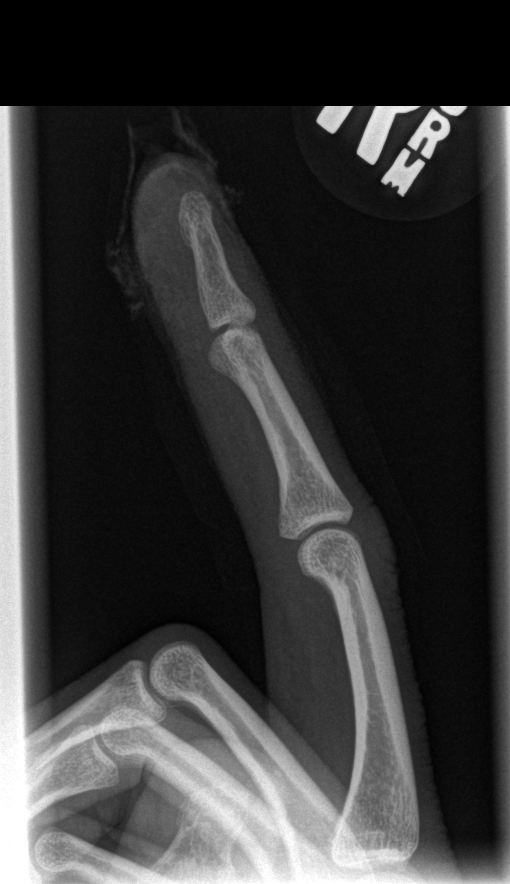

[3 of 3 positions shown; findings below may reference images not displayed]

FINDINGS: Soft tissue defect along the distal aspect of the right third
finger. Overlying bandage material obscures some detail but no
radiopaque foreign bodies are suggested. Underlying bones appear
intact. No acute fracture or dislocation.
IMPRESSION: Soft tissue defect of the distal right third finger. No radiopaque
soft tissue foreign bodies. No acute bony abnormalities.

## 2016-10-31 ENCOUNTER — Emergency Department (HOSPITAL_COMMUNITY)
Admission: EM | Admit: 2016-10-31 | Discharge: 2016-10-31 | Disposition: A | Discharge status: Home / Self Care | Payer: Self-pay | Attending: Emergency Medicine | Admitting: Emergency Medicine

## 2016-10-31 ENCOUNTER — Encounter (HOSPITAL_COMMUNITY): Payer: Self-pay

## 2016-10-31 DIAGNOSIS — Z87891 Personal history of nicotine dependence: Secondary | ICD-10-CM | POA: Insufficient documentation

## 2016-10-31 DIAGNOSIS — Z79899 Other long term (current) drug therapy: Secondary | ICD-10-CM | POA: Insufficient documentation

## 2016-10-31 DIAGNOSIS — Z202 Contact with and (suspected) exposure to infections with a predominantly sexual mode of transmission: Secondary | ICD-10-CM | POA: Insufficient documentation

## 2016-10-31 LAB — URINALYSIS, ROUTINE W REFLEX MICROSCOPIC
BILIRUBIN URINE: NEGATIVE
GLUCOSE, UA: NEGATIVE mg/dL
Hgb urine dipstick: NEGATIVE
KETONES UR: NEGATIVE mg/dL
LEUKOCYTES UA: NEGATIVE
Nitrite: NEGATIVE
Protein, ur: NEGATIVE mg/dL
Specific Gravity, Urine: 1.029 (ref 1.005–1.030)
pH: 7 (ref 5.0–8.0)

## 2016-10-31 MED ORDER — AZITHROMYCIN 250 MG PO TABS
1000.0000 mg | ORAL_TABLET | Freq: Once | ORAL | Status: AC
  Administered 2016-10-31: 1000 mg via ORAL
  Filled 2016-10-31: qty 4

## 2016-10-31 MED ORDER — CEFTRIAXONE SODIUM 250 MG IJ SOLR
250.0000 mg | Freq: Once | INTRAMUSCULAR | Status: AC
  Administered 2016-10-31: 250 mg via INTRAMUSCULAR
  Filled 2016-10-31: qty 250

## 2016-10-31 NOTE — Discharge Instructions (Signed)
You were treated for gonorrhea and chlamydia today. Results will likely return in the next 3 days. You'll receive a phone call if they're positive. If they're positive for gonorrhea Chlamydia, you do not need further treatment. If they positive for HIV or syphilis, you'll need to be treated at the health department or primary care. Do not have unprotected sexual intercourse in the next 2 weeks. In general, it is important to engage in safe sexual practices. Return to the emergency department if you develop fever, chills, abdominal pain, nausea, vomiting, or any new or worsening symptoms.

## 2016-10-31 NOTE — ED Provider Notes (Signed)
[facility] Provider Note   CSN: [id number] Arrival date & time: 10/31/16  1609  By signing my name below, I, Diona BrownerJennifer Gorman, attest that this documentation has been prepared under the direction and in the presence of Ezzie Senat, PA-C. Electronically Signed: Diona BrownerJennifer Gorman, ED Scribe. 10/31/16. 4:41 PM.  History   Chief Complaint Chief Complaint  Patient presents with  . v 74.5    HPI Jorge Williamson is a 26 y.o. male who presents to the Emergency Department with a chief complaint of an STD. Pt reports his girlfriend recently tested positive for chlamydia and they did not wait the 14 days before engaging in intercourse after treatment. Pt endorses mild, intermittent tingling/irritation when he urinates for the last few weeks. He has frequent, unprotected, intercourse with his girlfriend. Notes one sexual partner. Pt has been tested for STDs multiple times in the past, and tested positive for chlamydia and gonorrhea. Pt denies penile discharge, penile discharge, scrotal swelling, hematuria, dysuria, fever, chills, nausea, vomiting, or any other complaints at this time. Patient states he last had unprotected sex 2 weeks prior, however her friend had reported last unprotected sex was this morning.  The history is provided by the patient. No language interpreter was used.    History reviewed. No pertinent past medical history.  There are no active problems to display for this patient.   History reviewed. No pertinent surgical history.     Home Medications    Prior to Admission medications   Medication Sig Start Date End Date Taking? Authorizing Provider  ciprofloxacin (CIPRO) 250 MG tablet Take 1 tablet (250 mg total) by mouth 2 (two) times daily. Patient not taking: Reported on 06/18/2015 03/05/15   Dowless, Lelon MastSamantha Tripp, PA-C  guaiFENesin (ROBITUSSIN) 100 MG/5ML liquid Take 10 mLs (200 mg total) by mouth 3 (three) times daily as needed for cough. 06/18/15   Barbaraann BarthelBreen,  James O, MD  guaiFENesin (ROBITUSSIN) 100 MG/5ML liquid Take 10 mLs (200 mg total) by mouth 3 (three) times daily as needed for cough. 06/18/15   Barbaraann BarthelBreen, James O, MD  silver sulfADIAZINE (SILVADENE) 1 % cream Apply to burn twice a day until healed. Patient not taking: Reported on 06/18/2015 09/08/14   Charm RingsHonig, Erin J, MD  trimethoprim-polymyxin b Joaquim Lai(POLYTRIM) ophthalmic solution Place 1 drop into both eyes every 4 (four) hours. 09/08/15   Roxy HorsemanBrowning, Robert, PA-C    Family History History reviewed. No pertinent family history.  Social History Social History  Substance Use Topics  . Smoking status: Former Games developermoker  . Smokeless tobacco: Former NeurosurgeonUser  . Alcohol use No     Allergies   Patient has no known allergies.   Review of Systems Review of Systems  Constitutional: Negative for chills and fever.  Gastrointestinal: Negative for nausea and vomiting.  Genitourinary: Positive for penile pain (mild irritation). Negative for discharge, dysuria, hematuria, penile swelling and scrotal swelling.     Physical Exam Updated Vital Signs BP 129/66 (BP Location: Left Arm)   Pulse 74   Temp 98.9 F (37.2 C) (Oral)   Resp 18   Ht 5\' 11"  (1.803 m)   Wt 61.2 kg (135 lb)   SpO2 100%   BMI 18.83 kg/m   Physical Exam  Constitutional: He is oriented to person, place, and time. He appears well-developed and well-nourished.  HENT:  Head: Normocephalic.  Eyes: EOM are normal.  Neck: Normal range of motion.  Cardiovascular: Normal rate, regular rhythm and intact distal pulses.   Pulmonary/Chest: Effort normal and  breath sounds normal. No respiratory distress. He has no wheezes.  Abdominal: Soft. He exhibits no distension and no mass. There is no tenderness. There is no guarding. A hernia is present. Hernia confirmed negative in the right inguinal area and confirmed negative in the left inguinal area.  Genitourinary: Testes normal and penis normal. Right testis shows no mass, no swelling and no  tenderness. Left testis shows no mass, no swelling and no tenderness. Circumcised. No penile erythema or penile tenderness. No discharge found.  Genitourinary Comments: Chaperone present. Patient without discharge, swelling, or pain of the penis or testicles. No lesions noted.   Musculoskeletal: Normal range of motion.  Neurological: He is alert and oriented to person, place, and time.  Skin: Skin is warm and dry. No rash noted.  Psychiatric: He has a normal mood and affect.  Nursing note and vitals reviewed.    ED Treatments / Results  DIAGNOSTIC STUDIES: Oxygen Saturation is 100% on RA, normal by my interpretation.   COORDINATION OF CARE: 4:41 PM-Discussed next steps with pt which includes being treated for chlamydia and additional STD screening. Pt verbalized understanding and is agreeable with the plan.   Labs (all labs ordered are listed, but only abnormal results are displayed) Labs Reviewed  URINALYSIS, ROUTINE W REFLEX MICROSCOPIC  RPR  HIV ANTIBODY (ROUTINE TESTING)  GC/CHLAMYDIA PROBE AMP ([place]) NOT AT [facility]    EKG  EKG Interpretation None       Radiology No results found.  Procedures Procedures (including critical care time)  Medications Ordered in ED Medications  cefTRIAXone (ROCEPHIN) injection 250 mg (250 mg Intramuscular Given 10/31/16 1734)  azithromycin (ZITHROMAX) tablet 1,000 mg (1,000 mg Oral Given 10/31/16 1734)     Initial Impression / Assessment and Plan / ED Course  I have reviewed the triage vital signs and the nursing notes.  Pertinent labs & imaging results that were available during my care of the patient were reviewed by me and considered in my medical decision making (see chart for details).      Pt presents with concerns for possible STD.  Pt understands that they have GC/Chlamydia cultures pending and that they will need to inform all sexual partners if results return positive. Pt has been treated prophylactically with  azithromycin and Rocephin due to pts history.  Patient to be discharged with instructions to follow up with health department. Discussed importance of using protection when sexually active. Patient appears safe for discharge. Return precautions given. Patient states she understands and agrees to plan.   Final Clinical Impressions(s) / ED Diagnoses   Final diagnoses:  Exposure to STD    New Prescriptions New Prescriptions   No medications on file   I personally performed the services described in this documentation, which was scribed in my presence. The recorded information has been reviewed and is accurate.    Alveria Apley, PA-C 10/31/16 [address] PA-C 10/31/16 Jennye Boroughs, MD 11/01/16 1517

## 2016-10-31 NOTE — ED Triage Notes (Signed)
Pt states he was treated for trichomonas at the health dept in May.  States he is having tingling/irritation when he urinates x last few weeks.  Denies penile d/c.  He states he has frequent unprotected sex w/his girlfriend.

## 2016-11-01 LAB — RPR: RPR Ser Ql: NONREACTIVE

## 2016-11-01 LAB — HIV ANTIBODY (ROUTINE TESTING W REFLEX): HIV SCREEN 4TH GENERATION: NONREACTIVE

## 2016-11-02 LAB — GC/CHLAMYDIA PROBE AMP (~~LOC~~) NOT AT ARMC
Chlamydia: POSITIVE — AB
Neisseria Gonorrhea: NEGATIVE

## 2017-08-09 ENCOUNTER — Ambulatory Visit (HOSPITAL_COMMUNITY)
Admission: EM | Admit: 2017-08-09 | Discharge: 2017-08-09 | Disposition: A | Discharge status: Home / Self Care | Payer: Self-pay | Attending: Family Medicine | Admitting: Family Medicine

## 2017-08-09 ENCOUNTER — Encounter (HOSPITAL_COMMUNITY): Payer: Self-pay | Admitting: Emergency Medicine

## 2017-08-09 DIAGNOSIS — Z79899 Other long term (current) drug therapy: Secondary | ICD-10-CM | POA: Insufficient documentation

## 2017-08-09 DIAGNOSIS — J029 Acute pharyngitis, unspecified: Secondary | ICD-10-CM | POA: Insufficient documentation

## 2017-08-09 DIAGNOSIS — R509 Fever, unspecified: Secondary | ICD-10-CM

## 2017-08-09 DIAGNOSIS — Z87891 Personal history of nicotine dependence: Secondary | ICD-10-CM | POA: Insufficient documentation

## 2017-08-09 LAB — POCT RAPID STREP A: Streptococcus, Group A Screen (Direct): NEGATIVE

## 2017-08-09 MED ORDER — AMOXICILLIN 400 MG/5ML PO SUSR: 500.0000 mg | Freq: Two times a day (BID) | ORAL | 0 refills | Status: AC

## 2017-08-09 MED ORDER — ACETAMINOPHEN 325 MG PO TABS
650.0000 mg | ORAL_TABLET | Freq: Once | ORAL | Status: AC
  Administered 2017-08-09: 650 mg via ORAL

## 2017-08-09 MED ORDER — ACETAMINOPHEN 160 MG/5ML PO LIQD: 500.0000 mg | ORAL | 0 refills | Status: DC | PRN

## 2017-08-09 MED ORDER — ACETAMINOPHEN 160 MG/5ML PO SOLN
ORAL | Status: AC
  Filled 2017-08-09: qty 20.3

## 2017-08-09 NOTE — ED Provider Notes (Signed)
[facility]    CSN: [id number] Arrival date & time: 08/09/17  1136     History   Chief Complaint Chief Complaint  Patient presents with  . Sore Throat    HPI Jorge Williamson is a 27 y.o. male.   Jorge Williamson presents with complaints of sore throat and fever which started two days ago. No cough, runny nose or rash. Nausea and abdominal pain yesterday which has improved. Without vomiting or diarrhea. [place]ieces with URI symptoms but no other ill contacts. Tylenol has helped since arriving here. Has been taking nyquil which has not helped. Without contributing medical history.     ROS per HPI.      History reviewed. No pertinent past medical history.  There are no active problems to display for this patient.   History reviewed. No pertinent surgical history.     Home Medications    Prior to Admission medications   Medication Sig Start Date End Date Taking? Authorizing Provider  acetaminophen (TYLENOL) 160 MG/5ML liquid Take 15.6 mLs (500 mg total) by mouth every 4 (four) hours as needed for fever. 08/09/17   Georgetta Haber, NP  amoxicillin (AMOXIL) 400 MG/5ML suspension Take 6.3 mLs (500 mg total) by mouth 2 (two) times daily for 10 days. 08/09/17 08/19/17  Georgetta Haber, NP  ciprofloxacin (CIPRO) 250 MG tablet Take 1 tablet (250 mg total) by mouth 2 (two) times daily. Patient not taking: Reported on 06/18/2015 03/05/15   Dowless, Lelon Mast Tripp, PA-C  guaiFENesin (ROBITUSSIN) 100 MG/5ML liquid Take 10 mLs (200 mg total) by mouth 3 (three) times daily as needed for cough. 06/18/15   Barbaraann Barthel, MD  guaiFENesin (ROBITUSSIN) 100 MG/5ML liquid Take 10 mLs (200 mg total) by mouth 3 (three) times daily as needed for cough. 06/18/15   Barbaraann Barthel, MD  silver sulfADIAZINE (SILVADENE) 1 % cream Apply to burn twice a day until healed. Patient not taking: Reported on 06/18/2015 09/08/14   Charm Rings, MD  trimethoprim-polymyxin b Joaquim Lai) ophthalmic solution Place 1  drop into both eyes every 4 (four) hours. 09/08/15   Roxy Horseman, PA-C    Family History History reviewed. No pertinent family history.  Social History Social History   Tobacco Use  . Smoking status: Former Games developer  . Smokeless tobacco: Former Engineer, water Use Topics  . Alcohol use: No  . Drug use: No     Allergies   Patient has no known allergies.   Review of Systems Review of Systems   Physical Exam Triage Vital Signs ED Triage Vitals [08/09/17 1234]  Enc Vitals Group     BP 135/90     Pulse Rate 78     Resp 18     Temp (!) 102.2 F (39 C)     Temp Source Oral     SpO2 100 %     Weight      Height      Head Circumference      Peak Flow      Pain Score      Pain Loc      Pain Edu?      Excl. in GC?    No data found.  Updated Vital Signs BP 135/90 (BP Location: Left Arm)   Pulse 78   Temp (!) 102.2 F (39 C) (Oral)   Resp 18   SpO2 100%    Physical Exam  Constitutional: He is oriented to person, place, and time. He appears well-developed  and well-nourished.  HENT:  Head: Normocephalic and atraumatic.  Right Ear: Tympanic membrane, external ear and ear canal normal.  Left Ear: Tympanic membrane, external ear and ear canal normal.  Nose: Nose normal. Right sinus exhibits no maxillary sinus tenderness and no frontal sinus tenderness. Left sinus exhibits no maxillary sinus tenderness and no frontal sinus tenderness.  Mouth/Throat: Uvula is midline, oropharynx is clear and moist and mucous membranes are normal. Tonsils are 1+ on the right. Tonsils are 1+ on the left. Tonsillar exudate.  Eyes: Pupils are equal, round, and reactive to light. Conjunctivae are normal.  Neck: Normal range of motion.  Cardiovascular: Normal rate and regular rhythm.  Pulmonary/Chest: Effort normal and breath sounds normal.  Lymphadenopathy:    He has cervical adenopathy.  Neurological: He is alert and oriented to person, place, and time.  Skin: Skin is warm and dry.    Vitals reviewed.    UC Treatments / Results  Labs (all labs ordered are listed, but only abnormal results are displayed) Labs Reviewed  CULTURE, GROUP A STREP [facility])  POCT RAPID STREP A    EKG None  Radiology No results found.  Procedures Procedures (including critical care time)  Medications Ordered in UC Medications  acetaminophen (TYLENOL) tablet 650 mg (650 mg Oral Given 08/09/17 1238)    Initial Impression / Assessment and Plan / UC Course  I have reviewed the triage vital signs and the nursing notes.  Pertinent labs & imaging results that were available during my care of the patient were reviewed by me and considered in my medical decision making (see chart for details).     Negative rapid strep. Exudate and adenopathy present in the presence of fever. Will start antibiotics at this time, culture pending. Patient endorses that he pulled nurse's hand during sample collection so unknown if adequate throat swab. Push fluids to ensure adequate hydration and keep secretions thin. Tylenol and/or ibuprofen as needed for pain or fevers.  Return precautions provided. Patient verbalized understanding and agreeable to plan.    Final Clinical Impressions(s) / UC Diagnoses   Final diagnoses:  Pharyngitis, unspecified etiology     Discharge Instructions     We are still culturing your throat, will call if this comes back positive.  If it also remains negative you may discontinue taking antibiotic as this may then likely be viral in nature. Push fluids to ensure adequate hydration and keep secretions thin.  Tylenol and/or ibuprofen as needed for pain or fevers.   If symptoms worsen or do not improve in the next week to return to be seen or to follow up with your PCP.     ED Prescriptions    Medication Sig Dispense Auth. Provider   amoxicillin (AMOXIL) 400 MG/5ML suspension Take 6.3 mLs (500 mg total) by mouth 2 (two) times daily for 10 days. 200 mL Linus Mako B, NP    acetaminophen (TYLENOL) 160 MG/5ML liquid Take 15.6 mLs (500 mg total) by mouth every 4 (four) hours as needed for fever. 473 mL Linus Mako B, NP     Controlled Substance Prescriptions [place] Controlled Substance Registry consulted? Not Applicable   Georgetta Haber, NP 08/09/17 1328

## 2017-08-09 NOTE — ED Triage Notes (Signed)
Pt here with sore throat and fever

## 2017-08-09 NOTE — Discharge Instructions (Signed)
We are still culturing your throat, will call if this comes back positive.  If it also remains negative you may discontinue taking antibiotic as this may then likely be viral in nature. Push fluids to ensure adequate hydration and keep secretions thin.  Tylenol and/or ibuprofen as needed for pain or fevers.   If symptoms worsen or do not improve in the next week to return to be seen or to follow up with your PCP.

## 2017-08-11 LAB — CULTURE, GROUP A STREP (THRC)

## 2017-08-13 ENCOUNTER — Ambulatory Visit (HOSPITAL_COMMUNITY)
Admission: EM | Admit: 2017-08-13 | Discharge: 2017-08-13 | Disposition: A | Discharge status: Home / Self Care | Payer: Self-pay | Attending: Family Medicine | Admitting: Family Medicine

## 2017-08-13 ENCOUNTER — Encounter (HOSPITAL_COMMUNITY): Payer: Self-pay

## 2017-08-13 DIAGNOSIS — J029 Acute pharyngitis, unspecified: Secondary | ICD-10-CM

## 2017-08-13 MED ORDER — AZITHROMYCIN 250 MG PO TABS: ORAL_TABLET | ORAL | 0 refills | Status: DC

## 2017-08-13 MED ORDER — PREDNISONE 10 MG PO TABS: 20.0000 mg | ORAL_TABLET | Freq: Every day | ORAL | 0 refills | Status: DC
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
## 2017-08-13 NOTE — Discharge Instructions (Addendum)
Stop amoxicillin and begin Zithromax. Take prednisone as prescribed

## 2017-08-13 NOTE — ED Triage Notes (Signed)
Pt presents with complaints of sore throat x 1 week. Was recently seen and had negative strep test and given antibiotics. Reports no relief.

## 2019-08-14 ENCOUNTER — Ambulatory Visit (INDEPENDENT_AMBULATORY_CARE_PROVIDER_SITE_OTHER): Payer: Self-pay

## 2019-08-14 ENCOUNTER — Ambulatory Visit (HOSPITAL_COMMUNITY)
Admission: EM | Admit: 2019-08-14 | Discharge: 2019-08-14 | Disposition: A | Discharge status: Home / Self Care | Payer: Self-pay | Attending: Family Medicine | Admitting: Family Medicine

## 2019-08-14 ENCOUNTER — Encounter (HOSPITAL_COMMUNITY): Payer: Self-pay | Admitting: Emergency Medicine

## 2019-08-14 ENCOUNTER — Other Ambulatory Visit: Payer: Self-pay

## 2019-08-14 DIAGNOSIS — S40012A Contusion of left shoulder, initial encounter: Secondary | ICD-10-CM

## 2019-08-14 DIAGNOSIS — S4990XA Unspecified injury of shoulder and upper arm, unspecified arm, initial encounter: Secondary | ICD-10-CM

## 2019-08-14 DIAGNOSIS — S46812A Strain of other muscles, fascia and tendons at shoulder and upper arm level, left arm, initial encounter: Secondary | ICD-10-CM

## 2019-08-14 MED ORDER — CYCLOBENZAPRINE HCL 5 MG PO TABS: 5.0000 mg | ORAL_TABLET | Freq: Two times a day (BID) | ORAL | 0 refills | Status: DC | PRN

## 2019-08-14 MED ORDER — IBUPROFEN 100 MG/5ML PO SUSP: 600.0000 mg | Freq: Four times a day (QID) | ORAL | 0 refills | Status: DC | PRN

## 2019-08-14 NOTE — ED Triage Notes (Signed)
Patient c/o clavicle pain after wrestling today around 11 am. Patient is having trouble lifting left arm.
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
## 2019-08-14 NOTE — Discharge Instructions (Signed)
No clavicle fracture, likely bruised Use anti-inflammatories for pain/swelling. You may take up to 800 mg Ibuprofen every 8 hours with food. You may supplement Ibuprofen with Tylenol [phone] mg every 8 hours.   You may use flexeril as needed to help with pain. This is a muscle relaxer and causes sedation- please use only at bedtime or when you will be home and not have to drive/work  Ice  Follow up if not improving with time or regaining full movement at shoulder

## 2021-02-12 ENCOUNTER — Other Ambulatory Visit: Payer: Self-pay

## 2021-02-12 ENCOUNTER — Ambulatory Visit (HOSPITAL_COMMUNITY)
Admission: EM | Admit: 2021-02-12 | Discharge: 2021-02-12 | Disposition: A | Discharge status: Home / Self Care | Payer: Self-pay | Attending: Physician Assistant | Admitting: Physician Assistant

## 2021-02-12 ENCOUNTER — Encounter (HOSPITAL_COMMUNITY): Payer: Self-pay | Admitting: Emergency Medicine

## 2021-02-12 DIAGNOSIS — K047 Periapical abscess without sinus: Secondary | ICD-10-CM

## 2021-02-12 DIAGNOSIS — K0889 Other specified disorders of teeth and supporting structures: Secondary | ICD-10-CM

## 2021-02-12 DIAGNOSIS — K029 Dental caries, unspecified: Secondary | ICD-10-CM

## 2021-02-12 MED ORDER — AMOXICILLIN-POT CLAVULANATE 875-125 MG PO TABS: 1.0000 | ORAL_TABLET | Freq: Two times a day (BID) | ORAL | 0 refills | Status: DC

## 2021-02-12 MED ORDER — KETOROLAC TROMETHAMINE 60 MG/2ML IM SOLN
60.0000 mg | Freq: Once | INTRAMUSCULAR | Status: AC
  Administered 2021-02-12: 60 mg via INTRAMUSCULAR

## 2021-02-12 MED ORDER — KETOROLAC TROMETHAMINE 60 MG/2ML IM SOLN
INTRAMUSCULAR | Status: AC
  Filled 2021-02-12: qty 2
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
## 2021-02-12 NOTE — ED Triage Notes (Signed)
PT reports right lower dental pain, started yesterday, no injury

## 2021-02-12 NOTE — Discharge Instructions (Addendum)
Start Augmentin twice daily to cover for infection.  Use Tylenol for pain for the next 24 hours.  After that you can alternate Tylenol and ibuprofen.  Follow-up with dentist as we discussed.  If you have any difficulty speaking, difficulty swallowing, shortness of breath, swelling of your mouth or throat you need to go to the emergency room.

## 2021-09-23 ENCOUNTER — Encounter (HOSPITAL_COMMUNITY): Payer: Self-pay

## 2021-09-23 ENCOUNTER — Other Ambulatory Visit: Payer: Self-pay

## 2021-09-23 ENCOUNTER — Emergency Department (HOSPITAL_COMMUNITY)
Admission: EM | Admit: 2021-09-23 | Discharge: 2021-09-24 | Disposition: A | Discharge status: Home / Self Care | Payer: Self-pay | Attending: Emergency Medicine | Admitting: Emergency Medicine

## 2021-09-23 DIAGNOSIS — S0993XA Unspecified injury of face, initial encounter: Secondary | ICD-10-CM

## 2021-09-23 DIAGNOSIS — S01411A Laceration without foreign body of right cheek and temporomandibular area, initial encounter: Secondary | ICD-10-CM | POA: Insufficient documentation

## 2021-09-23 DIAGNOSIS — S0181XA Laceration without foreign body of other part of head, initial encounter: Secondary | ICD-10-CM

## 2021-09-23 MED ORDER — LIDOCAINE-EPINEPHRINE-TETRACAINE (LET) TOPICAL GEL
3.0000 mL | Freq: Once | TOPICAL | Status: AC
  Administered 2021-09-24: 3 mL via TOPICAL
  Filled 2021-09-23: qty 3
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
## 2021-09-24 NOTE — ED Notes (Signed)
10/20 bilaterally.

## 2021-10-22 ENCOUNTER — Emergency Department (HOSPITAL_COMMUNITY)
Admission: EM | Admit: 2021-10-22 | Discharge: 2021-10-22 | Disposition: A | Discharge status: Home / Self Care | Payer: Self-pay | Attending: Emergency Medicine | Admitting: Emergency Medicine

## 2021-10-22 ENCOUNTER — Encounter (HOSPITAL_COMMUNITY): Payer: Self-pay | Admitting: Emergency Medicine

## 2021-10-22 DIAGNOSIS — Z4802 Encounter for removal of sutures: Secondary | ICD-10-CM | POA: Insufficient documentation

## 2021-10-22 NOTE — ED Provider Notes (Signed)
[facility] EMERGENCY DEPARTMENT Provider Note   CSN: [id number] Arrival date & time: 10/22/21  1453     History Chief Complaint  Patient presents with   Suture / Staple Removal    Jorge Williamson is a 31 y.o. male presents the ER for suture removals under his right eye.  Patient reports that he had sutures placed on 4 July of this year and forgot that he needed to get them removed and could not member his discharge instructions.  He presents today for suture removal.  He reports he has not had any problems with the sutures.  Denies any blurry vision or any eye pain.  Denies any pain on movement of his eyes.  Denies any purulent discharge reports has been healing well.   Suture / Staple Removal       Home Medications Prior to Admission medications   Medication Sig Start Date End Date Taking? Authorizing Provider  acetaminophen (TYLENOL) 160 MG/5ML liquid Take 15.6 mLs (500 mg total) by mouth every 4 (four) hours as needed for fever. 08/09/17   Georgetta Haber, NP  amoxicillin-clavulanate (AUGMENTIN) 875-125 MG tablet Take 1 tablet by mouth every 12 (twelve) hours. 02/12/21   Raspet, Noberto Retort, PA-C  cyclobenzaprine (FLEXERIL) 5 MG tablet Take 1-2 tablets (5-10 mg total) by mouth 2 (two) times daily as needed for muscle spasms. 08/14/19   Wieters, Hallie C, PA-C  guaiFENesin (ROBITUSSIN) 100 MG/5ML liquid Take 10 mLs (200 mg total) by mouth 3 (three) times daily as needed for cough. 06/18/15   Barbaraann Barthel, MD  guaiFENesin (ROBITUSSIN) 100 MG/5ML liquid Take 10 mLs (200 mg total) by mouth 3 (three) times daily as needed for cough. 06/18/15   Barbaraann Barthel, MD  ibuprofen (ADVIL) 100 MG/5ML suspension Take 30 mLs (600 mg total) by mouth every 6 (six) hours as needed. 08/14/19   Wieters, Hallie C, PA-C  silver sulfADIAZINE (SILVADENE) 1 % cream Apply to burn twice a day until healed. Patient not taking: Reported on 06/18/2015 09/08/14   Charm Rings, MD      Allergies     Patient has no known allergies.    Review of Systems   Review of Systems  Skin:        Reports need suture removal    Physical Exam Updated Vital Signs BP 134/63 (BP Location: Right Arm)   Pulse 65   Temp 98.9 F (37.2 C) (Oral)   Resp 18   SpO2 100%  Physical Exam Constitutional:      Appearance: Normal appearance.  Eyes:     General: No scleral icterus.    Comments: 2 sutures placed under the right eye near the edge of the orbit.  No crusting noted.  Skin appears to be healed.  Pulmonary:     Effort: Pulmonary effort is normal. No respiratory distress.  Skin:    General: Skin is dry.     Findings: No rash.  Neurological:     General: No focal deficit present.     Mental Status: He is alert. Mental status is at baseline.  Psychiatric:        Mood and Affect: Mood normal.     ED Results / Procedures / Treatments   Labs (all labs ordered are listed, but only abnormal results are displayed) Labs Reviewed - No data to display  EKG None  Radiology No results found.  Procedures .Suture Removal  Date/Time: 10/22/2021 4:10 PM  Performed by: Alison Murray,  Victory Dakin, PA-C Authorized by: Achille Rich, PA-C   Consent:    Consent obtained:  Verbal   Consent given by:  Patient   Risks, benefits, and alternatives were discussed: yes     Risks discussed:  Bleeding, pain and wound separation   Alternatives discussed:  No treatment Universal protocol:    Procedure explained and questions answered to patient or proxy's satisfaction: yes     Patient identity confirmed:  Verbally with patient Location:    Location:  Head/neck   Head/neck location: Under right eye. Procedure details:    Number of sutures removed:  2 Post-procedure details:    Procedure completion:  Tolerated well, no immediate complications    Medications Ordered in ED Medications - No data to display  ED Course/ Medical Decision Making/ A&P                           Medical Decision  Making  31 year old male presents here to the emergency department for evaluation of suture removal.  Vitals are stable.  Physical exam as listed above.    Sutures have been placed for almost a month although appear to be well-appearing.  Patient's scar is healed nicely and it does not have any scabbing.  I was able to remove 2 sutures without any complications.  Patient denies any problems reports the areas been healing well.  Recommended that he still keep the area clean.  We discussed return precautions about symptoms.  Patient verbalized understanding agrees to the plan.  Patient is stable be discharged home in good condition.  Final Clinical Impression(s) / ED Diagnoses Final diagnoses:  Visit for suture removal    Rx / DC Orders ED Discharge Orders     None         Achille Rich, PA-C 10/22/21 1613    Gerhard Munch, MD 10/22/21 [phone]

## 2021-10-22 NOTE — Discharge Instructions (Signed)
Contact a health care provider if: You have more redness, swelling, or pain around your wound. You have fluid or blood coming from your wound. You have new warmth, a rash, or hardness at the wound site. You have pus or a bad smell coming from your wound. Your wound opens up. Get help right away if: You have a fever or chills. You have red streaks coming from your wound. 

## 2021-10-22 NOTE — ED Triage Notes (Signed)
Patient here requesting suture removal from right of face. Patient has no complaints.

## 2021-10-26 ENCOUNTER — Encounter (HOSPITAL_COMMUNITY): Payer: Self-pay

## 2021-10-26 ENCOUNTER — Ambulatory Visit (HOSPITAL_COMMUNITY)
Admission: EM | Admit: 2021-10-26 | Discharge: 2021-10-26 | Disposition: A | Discharge status: Home / Self Care | Payer: Self-pay | Attending: Physician Assistant | Admitting: Physician Assistant

## 2021-10-26 DIAGNOSIS — R300 Dysuria: Secondary | ICD-10-CM | POA: Insufficient documentation

## 2021-10-26 DIAGNOSIS — Z113 Encounter for screening for infections with a predominantly sexual mode of transmission: Secondary | ICD-10-CM | POA: Insufficient documentation

## 2021-10-26 LAB — HIV ANTIBODY (ROUTINE TESTING W REFLEX): HIV Screen 4th Generation wRfx: NONREACTIVE

## 2021-10-26 LAB — POCT URINALYSIS DIPSTICK, ED / UC
Bilirubin Urine: NEGATIVE
Glucose, UA: NEGATIVE mg/dL
Hgb urine dipstick: NEGATIVE
Ketones, ur: NEGATIVE mg/dL
Leukocytes,Ua: NEGATIVE
Nitrite: NEGATIVE
Protein, ur: NEGATIVE mg/dL
Specific Gravity, Urine: >=1.03 (ref 1.005–1.030)
Urobilinogen, UA: 0.2 mg/dL (ref 0.0–1.0)
pH: 5.5 (ref 5.0–8.0)

## 2021-10-26 NOTE — ED Triage Notes (Signed)
Pt is here for STI testing. Pt states he is difficulty urinating x1wk

## 2021-10-26 NOTE — Discharge Instructions (Addendum)
Labs have been drawn to check for STI exposure, these will be completed in 48 hours, you can check the results on MyChart, if my nurse does not call you within 48 hours that indicates the lab results are negative or normal. Advised to follow-up with PCP or return to urgent care if symptoms fail to improve.
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
## 2021-10-27 LAB — CYTOLOGY, (ORAL, ANAL, URETHRAL) ANCILLARY ONLY
Chlamydia: NEGATIVE
Comment: NEGATIVE
Comment: NEGATIVE
Comment: NORMAL
Neisseria Gonorrhea: NEGATIVE
Trichomonas: POSITIVE — AB

## 2021-10-27 LAB — RPR: RPR Ser Ql: NONREACTIVE

## 2021-10-28 ENCOUNTER — Telehealth (HOSPITAL_COMMUNITY): Payer: Self-pay | Admitting: Emergency Medicine

## 2021-10-28 MED ORDER — METRONIDAZOLE 500 MG PO TABS: 2000.0000 mg | ORAL_TABLET | Freq: Once | ORAL | 0 refills | Status: AC

## 2021-11-09 ENCOUNTER — Ambulatory Visit (HOSPITAL_COMMUNITY)
Admission: EM | Admit: 2021-11-09 | Discharge: 2021-11-09 | Disposition: A | Discharge status: Home / Self Care | Payer: Self-pay | Attending: Physician Assistant | Admitting: Physician Assistant

## 2021-11-09 ENCOUNTER — Encounter (HOSPITAL_COMMUNITY): Payer: Self-pay | Admitting: Emergency Medicine

## 2021-11-09 DIAGNOSIS — Z113 Encounter for screening for infections with a predominantly sexual mode of transmission: Secondary | ICD-10-CM | POA: Insufficient documentation

## 2021-11-09 DIAGNOSIS — Z202 Contact with and (suspected) exposure to infections with a predominantly sexual mode of transmission: Secondary | ICD-10-CM

## 2021-11-09 NOTE — ED Triage Notes (Signed)
Follow up STD re-check. Denies having any continuing symptoms.
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
## 2021-11-10 LAB — CYTOLOGY, (ORAL, ANAL, URETHRAL) ANCILLARY ONLY
Chlamydia: NEGATIVE
Comment: NEGATIVE
Comment: NEGATIVE
Comment: NORMAL
Neisseria Gonorrhea: NEGATIVE
Trichomonas: NEGATIVE

## 2021-11-14 ENCOUNTER — Encounter (HOSPITAL_COMMUNITY): Payer: Self-pay | Admitting: Emergency Medicine

## 2021-11-14 ENCOUNTER — Ambulatory Visit (HOSPITAL_COMMUNITY)
Admission: EM | Admit: 2021-11-14 | Discharge: 2021-11-14 | Disposition: A | Discharge status: Home / Self Care | Payer: Self-pay | Attending: Internal Medicine | Admitting: Internal Medicine

## 2021-11-14 DIAGNOSIS — J029 Acute pharyngitis, unspecified: Secondary | ICD-10-CM

## 2021-11-14 LAB — POCT RAPID STREP A, ED / UC: Streptococcus, Group A Screen (Direct): NEGATIVE

## 2021-11-14 MED ORDER — AMOXICILLIN 500 MG PO CAPS: 500.0000 mg | ORAL_CAPSULE | Freq: Two times a day (BID) | ORAL | 0 refills | Status: DC

## 2021-11-14 NOTE — Discharge Instructions (Addendum)
Pharyngitis Strep Test is negative however based on your images shown; exudate Started on Amoxicillin 500 mg  We encourage conservative treatment with symptom relief. We encourage you to use Tylenol alternating with Ibuprofen for your fever if not contraindicated. (Remember to use as directed do not exceed daily dosing recommendations) We also encourage salt water gargles for your sore throat. You should also consider throat lozenges and chloraseptic spray.

## 2021-11-14 NOTE — ED Provider Notes (Signed)
[facility]    CSN: [id number] Arrival date & time: 11/14/21  1839      History   Chief Complaint Chief Complaint  Patient presents with   Sore Throat    HPI Jorge Williamson is a 31 y.o. male.   HPI  History reviewed. No pertinent past medical history.  There are no problems to display for this patient.   History reviewed. No pertinent surgical history.     Home Medications    Prior to Admission medications   Medication Sig Start Date End Date Taking? Authorizing Provider  acetaminophen (TYLENOL) 160 MG/5ML liquid Take 15.6 mLs (500 mg total) by mouth every 4 (four) hours as needed for fever. 08/09/17   Georgetta Haber, NP  amoxicillin-clavulanate (AUGMENTIN) 875-125 MG tablet Take 1 tablet by mouth every 12 (twelve) hours. 02/12/21   Raspet, Noberto Retort, PA-C  cyclobenzaprine (FLEXERIL) 5 MG tablet Take 1-2 tablets (5-10 mg total) by mouth 2 (two) times daily as needed for muscle spasms. 08/14/19   Wieters, Hallie C, PA-C  guaiFENesin (ROBITUSSIN) 100 MG/5ML liquid Take 10 mLs (200 mg total) by mouth 3 (three) times daily as needed for cough. 06/18/15   Barbaraann Barthel, MD  guaiFENesin (ROBITUSSIN) 100 MG/5ML liquid Take 10 mLs (200 mg total) by mouth 3 (three) times daily as needed for cough. 06/18/15   Barbaraann Barthel, MD  ibuprofen (ADVIL) 100 MG/5ML suspension Take 30 mLs (600 mg total) by mouth every 6 (six) hours as needed. 08/14/19   Wieters, Hallie C, PA-C  silver sulfADIAZINE (SILVADENE) 1 % cream Apply to burn twice a day until healed. Patient not taking: Reported on 06/18/2015 09/08/14   Charm Rings, MD    Family History No family history on file.  Social History Social History   Tobacco Use   Smoking status: Former   Smokeless tobacco: Former  Building services engineer Use: Never used  Substance Use Topics   Alcohol use: No   Drug use: No     Allergies   Patient has no known allergies.   Review of Systems Review of Systems   Physical  Exam Triage Vital Signs ED Triage Vitals [11/14/21 1925]  Enc Vitals Group     BP 128/85     Pulse Rate 74     Resp 14     Temp 98.5 F (36.9 C)     Temp Source Oral     SpO2 99 %     Weight      Height      Head Circumference      Peak Flow      Pain Score 5     Pain Loc      Pain Edu?      Excl. in GC?    No data found.  Updated Vital Signs BP 128/85 (BP Location: Right Arm)   Pulse 74   Temp 98.5 F (36.9 C) (Oral)   Resp 14   SpO2 99%   Visual Acuity Right Eye Distance:   Left Eye Distance:   Bilateral Distance:    Right Eye Near:   Left Eye Near:    Bilateral Near:     Physical Exam   UC Treatments / Results  Labs (all labs ordered are listed, but only abnormal results are displayed) Labs Reviewed  POCT RAPID STREP A, ED / UC    EKG   Radiology No results found.  Procedures Procedures (including critical care time)  Medications Ordered in UC Medications - No data to display  Initial Impression / Assessment and Plan / UC Course  I have reviewed the triage vital signs and the nursing notes.  Pertinent labs & imaging results that were available during my care of the patient were reviewed by me and considered in my medical decision making (see chart for details).     *** Final Clinical Impressions(s) / UC Diagnoses   Final diagnoses:  None   Discharge Instructions   None    ED Prescriptions   None    PDMP not reviewed this encounter.

## 2021-11-14 NOTE — ED Triage Notes (Signed)
Pt c/o sore throat and thinks uvula is swollen since Tuesday.

## 2021-11-17 ENCOUNTER — Telehealth (HOSPITAL_COMMUNITY): Payer: Self-pay | Admitting: Internal Medicine

## 2021-11-17 ENCOUNTER — Telehealth (HOSPITAL_COMMUNITY): Payer: Self-pay | Admitting: *Deleted

## 2021-11-17 MED ORDER — AMOXICILLIN 250 MG/5ML PO SUSR: 500.0000 mg | Freq: Two times a day (BID) | ORAL | 0 refills | Status: AC

## 2021-12-29 NOTE — Telephone Encounter (Signed)
Medication sent to pharmacy  

## 2022-02-01 ENCOUNTER — Ambulatory Visit (HOSPITAL_COMMUNITY)
Admission: EM | Admit: 2022-02-01 | Discharge: 2022-02-01 | Disposition: A | Discharge status: Home / Self Care | Payer: Self-pay | Attending: Emergency Medicine | Admitting: Emergency Medicine

## 2022-02-01 ENCOUNTER — Other Ambulatory Visit: Payer: Self-pay

## 2022-02-01 ENCOUNTER — Encounter (HOSPITAL_COMMUNITY): Payer: Self-pay | Admitting: Emergency Medicine

## 2022-02-01 DIAGNOSIS — N5312 Painful ejaculation: Secondary | ICD-10-CM | POA: Insufficient documentation

## 2022-02-01 LAB — POCT URINALYSIS DIPSTICK, ED / UC
Bilirubin Urine: NEGATIVE
Glucose, UA: NEGATIVE mg/dL
Hgb urine dipstick: NEGATIVE
Ketones, ur: NEGATIVE mg/dL
Leukocytes,Ua: NEGATIVE
Nitrite: NEGATIVE
Protein, ur: NEGATIVE mg/dL
Specific Gravity, Urine: 1.015 (ref 1.005–1.030)
Urobilinogen, UA: 0.2 mg/dL (ref 0.0–1.0)
pH: 6.5 (ref 5.0–8.0)

## 2022-02-01 NOTE — ED Triage Notes (Signed)
Pain post ejaculation.  Denies pain with urination.  Onset a couple of days.

## 2022-02-01 NOTE — ED Provider Notes (Signed)
[place]    CSN: [id number] Arrival date & time: 02/01/22  1733      History   Chief Complaint Chief Complaint  Patient presents with   Exposure to STD    HPI Jorge Williamson is a 31 y.o. male.   Patient presents requesting STD testing after experiencing pain with ejaculation for 2 to 3 days.  2 partners within the last month, no condom use, no known exposure.  Denies penile discharge, penile or testicle swelling, dysuria, frequency or urgency, new rash or lesions.    History reviewed. No pertinent past medical history.  There are no problems to display for this patient.   History reviewed. No pertinent surgical history.     Home Medications    Prior to Admission medications   Medication Sig Start Date End Date Taking? Authorizing Provider  acetaminophen (TYLENOL) 160 MG/5ML liquid Take 15.6 mLs (500 mg total) by mouth every 4 (four) hours as needed for fever. 08/09/17   Zigmund Gottron, NP  ibuprofen (ADVIL) 100 MG/5ML suspension Take 30 mLs (600 mg total) by mouth every 6 (six) hours as needed. 08/14/19   Wieters, Elesa Hacker, PA-C    Family History History reviewed. No pertinent family history.  Social History Social History   Tobacco Use   Smoking status: Former   Smokeless tobacco: Former  Scientific laboratory technician Use: Never used  Substance Use Topics   Alcohol use: No   Drug use: No     Allergies   Patient has no known allergies.   Review of Systems Review of Systems  Constitutional: Negative.   Respiratory: Negative.    Cardiovascular: Negative.   Skin: Negative.      Physical Exam Triage Vital Signs ED Triage Vitals  Enc Vitals Group     BP 02/01/22 1829 132/80     Pulse Rate 02/01/22 1829 74     Resp 02/01/22 1829 18     Temp 02/01/22 1829 98.9 F (37.2 C)     Temp Source 02/01/22 1829 Oral     SpO2 02/01/22 1829 100 %     Weight --      Height --      Head Circumference --      Peak Flow --      Pain Score 02/01/22  1828 0     Pain Loc --      Pain Edu? --      Excl. in [place]? --    No data found.  Updated Vital Signs BP 132/80 (BP Location: Left Arm)   Pulse 74   Temp 98.9 F (37.2 C) (Oral)   Resp 18   SpO2 100%   Visual Acuity Right Eye Distance:   Left Eye Distance:   Bilateral Distance:    Right Eye Near:   Left Eye Near:    Bilateral Near:     Physical Exam Constitutional:      Appearance: Normal appearance.  HENT:     Head: Normocephalic.  Eyes:     Extraocular Movements: Extraocular movements intact.  Pulmonary:     Effort: Pulmonary effort is normal.  Genitourinary:    Comments: deferred Neurological:     Mental Status: He is alert and oriented to person, place, and time. Mental status is at baseline.  Psychiatric:        Mood and Affect: Mood normal.        Behavior: Behavior normal.      UC Treatments /  Results  Labs (all labs ordered are listed, but only abnormal results are displayed) Labs Reviewed  CYTOLOGY, (ORAL, ANAL, URETHRAL) ANCILLARY ONLY    EKG   Radiology No results found.  Procedures Procedures (including critical care time)  Medications Ordered in UC Medications - No data to display  Initial Impression / Assessment and Plan / UC Course  I have reviewed the triage vital signs and the nursing notes.  Pertinent labs & imaging results that were available during my care of the patient were reviewed by me and considered in my medical decision making (see chart for details).  Painful ejaculation  Urinalysis is negative, STI labs are pending, advised abstinence until all lab results and/or treatment is complete, advised condom use during all sexual encounters moving forward, if all testing today is negative recommended follow-up with urology, walking referral given Final Clinical Impressions(s) / UC Diagnoses   Final diagnoses:  None   Discharge Instructions   None    ED Prescriptions   None    PDMP not reviewed this encounter.    Valinda Hoar, [place] 02/04/22 [phone]

## 2022-02-01 NOTE — Discharge Instructions (Signed)
Labs pending 2-3 days, you will be contacted if positive for any sti and treatment will be sent to the pharmacy, you will have to return to the clinic if positive for gonorrhea to receive treatment   Urinalysis negative  Please refrain from having sex until labs results, if positive please refrain from having sex until treatment complete and symptoms resolve   If positive for  Chlamydia  gonorrhea or trichomoniasis please notify partner or partners so they may tested as well  Moving forward, it is recommended you use some form of protection against the transmission of sti infections  such as condoms or dental dams with each sexual encounter

## 2022-02-02 LAB — CYTOLOGY, (ORAL, ANAL, URETHRAL) ANCILLARY ONLY
Chlamydia: NEGATIVE
Comment: NEGATIVE
Comment: NEGATIVE
Comment: NORMAL
Neisseria Gonorrhea: NEGATIVE
Trichomonas: NEGATIVE

## 2022-02-18 ENCOUNTER — Ambulatory Visit (HOSPITAL_COMMUNITY)
Admission: EM | Admit: 2022-02-18 | Discharge: 2022-02-18 | Disposition: A | Discharge status: Home / Self Care | Payer: Self-pay | Attending: Family Medicine | Admitting: Family Medicine

## 2022-02-18 ENCOUNTER — Encounter (HOSPITAL_COMMUNITY): Payer: Self-pay

## 2022-02-18 DIAGNOSIS — J029 Acute pharyngitis, unspecified: Secondary | ICD-10-CM

## 2022-02-18 LAB — POCT RAPID STREP A, ED / UC: Streptococcus, Group A Screen (Direct): NEGATIVE

## 2022-02-18 MED ORDER — DIPHENHYDRAMINE HCL 12.5 MG/5ML PO LIQD: 5.0000 mL | Freq: Four times a day (QID) | ORAL | 0 refills | Status: DC | PRN

## 2022-02-18 NOTE — Discharge Instructions (Addendum)
You may use over the counter ibuprofen or acetaminophen as needed.  For a sore throat, over the counter products such as [employer] Peroxyl Mouth Sore Rinse or Chloraseptic Sore Throat Spray may provide some temporary relief. Your rapid strep test was negative today. We have sent your throat swab for culture and will let you know of any positive results. 

## 2022-02-18 NOTE — ED Triage Notes (Signed)
Chief Complaint: sore throat, with redness and swelling. Pain with swallowing.   Onset: 1 week   OTC medications tried: No    Sick exposure: No  New foods or medications: No  Recent Travel: No

## 2022-02-20 ENCOUNTER — Other Ambulatory Visit: Payer: Self-pay

## 2022-02-20 ENCOUNTER — Emergency Department (HOSPITAL_COMMUNITY)
Admission: EM | Admit: 2022-02-20 | Discharge: 2022-02-20 | Disposition: A | Discharge status: Home / Self Care | Payer: Self-pay | Attending: Emergency Medicine | Admitting: Emergency Medicine

## 2022-02-20 DIAGNOSIS — Z1152 Encounter for screening for COVID-19: Secondary | ICD-10-CM | POA: Insufficient documentation

## 2022-02-20 DIAGNOSIS — R591 Generalized enlarged lymph nodes: Secondary | ICD-10-CM | POA: Insufficient documentation

## 2022-02-20 DIAGNOSIS — R509 Fever, unspecified: Secondary | ICD-10-CM | POA: Insufficient documentation

## 2022-02-20 DIAGNOSIS — J029 Acute pharyngitis, unspecified: Secondary | ICD-10-CM | POA: Insufficient documentation

## 2022-02-20 DIAGNOSIS — J039 Acute tonsillitis, unspecified: Secondary | ICD-10-CM

## 2022-02-20 DIAGNOSIS — R000 Tachycardia, unspecified: Secondary | ICD-10-CM | POA: Insufficient documentation

## 2022-02-20 LAB — SARS CORONAVIRUS 2 BY RT PCR: SARS Coronavirus 2 by RT PCR: NEGATIVE

## 2022-02-20 LAB — GROUP A STREP BY PCR: Group A Strep by PCR: NOT DETECTED

## 2022-02-20 MED ORDER — AMOXICILLIN-POT CLAVULANATE 875-125 MG PO TABS: 1.0000 | ORAL_TABLET | Freq: Two times a day (BID) | ORAL | 0 refills | Status: DC

## 2022-02-20 MED ORDER — CLINDAMYCIN HCL 150 MG PO CAPS
300.0000 mg | ORAL_CAPSULE | Freq: Once | ORAL | Status: DC
  Filled 2022-02-20: qty 2

## 2022-02-20 MED ORDER — ACETAMINOPHEN 500 MG PO TABS
1000.0000 mg | ORAL_TABLET | Freq: Once | ORAL | Status: AC
  Administered 2022-02-20: 1000 mg via ORAL
  Filled 2022-02-20: qty 2

## 2022-02-20 MED ORDER — AMOXICILLIN-POT CLAVULANATE 875-125 MG PO TABS
1.0000 | ORAL_TABLET | Freq: Once | ORAL | Status: AC
  Administered 2022-02-20: 1 via ORAL
  Filled 2022-02-20: qty 1

## 2022-02-20 NOTE — Discharge Instructions (Signed)
You likely have tonsillitis or infection of your tonsils.  Your strep test is negative right now but I still want to give you some antibiotics for the tonsillitis  Take Tylenol or Motrin for fever  Take Augmentin twice a day for a week  See your doctor for follow-up  Return to ER if you have worse throat swelling or pain or trouble swallowing or fever

## 2022-02-20 NOTE — ED Triage Notes (Signed)
Here for sore throat. Seen at [facility] 2d ago. Not taking any meds or treatments. Fever noted in triage of 102.9. Throat/ tonsils red swollen and inflamed.

## 2022-02-20 NOTE — ED Notes (Signed)
RN reviewed discharge instructions with pt. Pt verbalized understanding and had no further questions. VSS upon discharge.  

## 2022-02-20 NOTE — ED Provider Notes (Signed)
[facility] EMERGENCY DEPARTMENT Provider Note   CSN: [id number] Arrival date & time: 02/20/22  1537     History  Chief Complaint  Patient presents with   Sore Throat    Jorge Williamson is a 31 y.o. male here presenting with sore throat.  Patient has been having sore throat for several days.  Patient was seen at urgent care 2 days ago had negative strep test and was given Magic mouthwash with no relief.  Patient started running a fever and has persistent sore throat.  Patient denies throat closing or trouble swallowing.  Patient was noted to be febrile and tachycardic in triage.  The history is provided by the patient.       Home Medications Prior to Admission medications   Medication Sig Start Date End Date Taking? Authorizing Provider  acetaminophen (TYLENOL) 160 MG/5ML liquid Take 15.6 mLs (500 mg total) by mouth every 4 (four) hours as needed for fever. 08/09/17   Georgetta Haber, NP  ibuprofen (ADVIL) 100 MG/5ML suspension Take 30 mLs (600 mg total) by mouth every 6 (six) hours as needed. 08/14/19   Wieters, Hallie C, PA-C  magic mouthwash (lidocaine, diphenhydrAMINE, alum & mag hydroxide) suspension Swish and spit 5 mLs 4 (four) times daily as needed for mouth pain. 02/18/22   Mardella Layman, MD      Allergies    Patient has no known allergies.    Review of Systems   Review of Systems  HENT:  Positive for sore throat.   All other systems reviewed and are negative.   Physical Exam Updated Vital Signs BP 121/88 (BP Location: Right Arm)   Pulse (!) 110   Temp (!) 102.9 F (39.4 C) (Oral)   Resp 20   Ht 5\' 11"  (1.803 m)   Wt 63.5 kg   SpO2 100%   BMI 19.53 kg/m  Physical Exam Vitals and nursing note reviewed.  Constitutional:      Appearance: He is well-developed.  HENT:     Head: Normocephalic.     Mouth/Throat:     Comments: Patient has tonsillar exudates on the left side more than the right.  Uvula is midline. Neck:     Comments: Mild  cervical lymphadenopathy on the left side Cardiovascular:     Rate and Rhythm: Normal rate.  Abdominal:     General: Bowel sounds are normal.     Palpations: Abdomen is soft.  Skin:    General: Skin is warm.     Capillary Refill: Capillary refill takes less than 2 seconds.  Neurological:     Mental Status: He is alert and oriented to person, place, and time.  Psychiatric:        Mood and Affect: Mood normal.     ED Results / Procedures / Treatments   Labs (all labs ordered are listed, but only abnormal results are displayed) Labs Reviewed  SARS CORONAVIRUS 2 BY RT PCR  GROUP A STREP BY PCR    EKG None  Radiology No results found.  Procedures Procedures    Medications Ordered in ED Medications  clindamycin (CLEOCIN) capsule 300 mg (has no administration in time range)  acetaminophen (TYLENOL) tablet 1,000 mg (1,000 mg Oral Given 02/20/22 1635)    ED Course/ Medical Decision Making/ A&P                           Medical Decision Making Jorge Williamson is a  31 y.o. male here presenting with sore throat.  Patient has sore throat going on for several days.  Went to urgent care and had negative strep test 2 days ago.  Patient has another negative strep test today as well as negative COVID.  However clinically he has tonsillitis on the left side.  He is also febrile as well.  My clinical suspicion for strep pharyngitis is high given the fever and the tonsillar exudates.  I will empirically treat him for strep despite the negative test.  Patient has no signs of peritonsillar abscess currently.  Gave strict return precautions.   Amount and/or Complexity of Data Reviewed Labs: ordered. Decision-making details documented in ED Course.  Risk Prescription drug management.    Final Clinical Impression(s) / ED Diagnoses Final diagnoses:  None    Rx / DC Orders ED Discharge Orders     None         Charlynne Pander, MD 02/20/22 [phone]

## 2022-02-21 IMAGING — DX DG CLAVICLE*L*
2 series · 2 of 2 positions shown · non-contrast
Comparison: None.

CLINICAL DATA: Left clavicular pain after injury.

EXAM:
LEFT CLAVICLE - 2+ VIEWS

[clavicle ap]
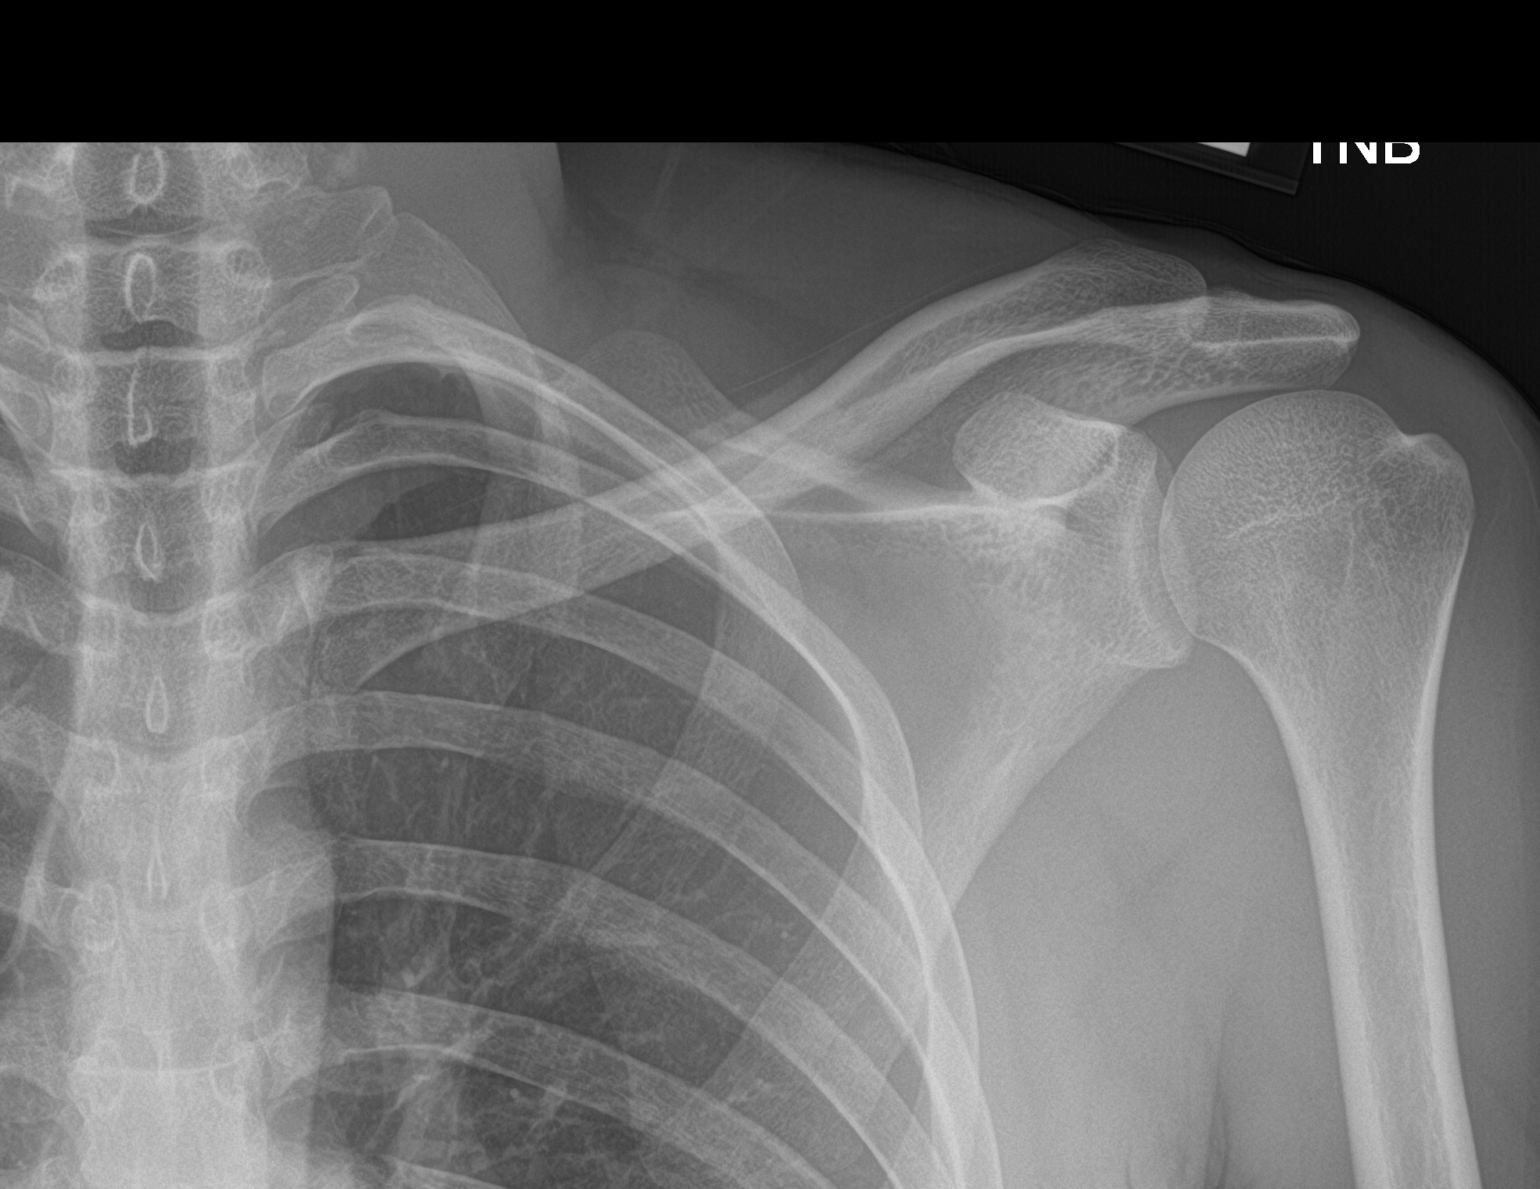

[clavicle axial]
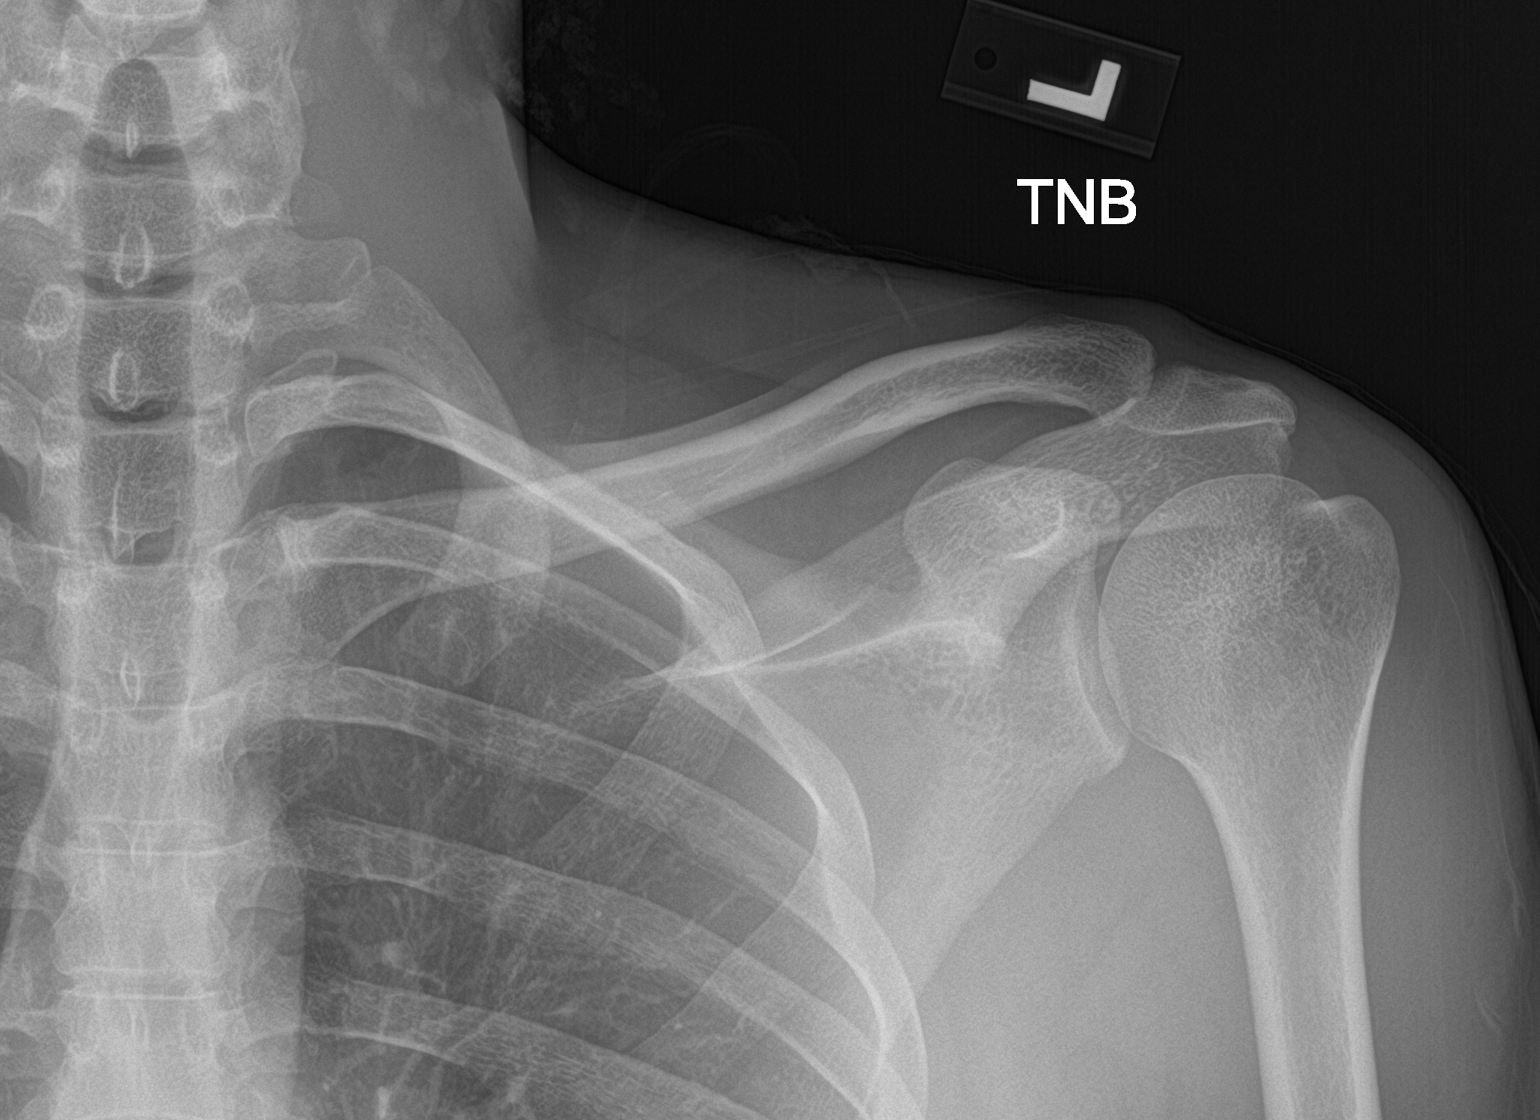

[2 of 2 positions shown; findings below may reference images not displayed]

FINDINGS: There is no evidence of fracture or other focal bone lesions. Soft
tissues are unremarkable.
IMPRESSION: Negative.

## 2022-02-21 NOTE — ED Provider Notes (Signed)
  [facility]   [id number] 02/18/22 Arrival Time: 1744  ASSESSMENT & PLAN:  1. Sore throat     No signs of peritonsillar abscess. Discussed.  Meds ordered this encounter  Medications   magic mouthwash (lidocaine, diphenhydrAMINE, alum & mag hydroxide) suspension    Sig: Swish and spit 5 mLs 4 (four) times daily as needed for mouth pain.    Dispense:  360 mL    Refill:  0    Results for orders placed or performed during the hospital encounter of 02/18/22  POCT Rapid Strep A  Result Value Ref Range   Streptococcus, Group A Screen (Direct) NEGATIVE NEGATIVE   Labs Reviewed  POCT RAPID STREP A, ED / UC  Strep culture sent.     Discharge Instructions      You may use over the counter ibuprofen or acetaminophen as needed.  For a sore throat, over the counter products such as [employer] Peroxyl Mouth Sore Rinse or Chloraseptic Sore Throat Spray may provide some temporary relief. Your rapid strep test was negative today. We have sent your throat swab for culture and will let you know of any positive results.    Reviewed expectations re: course of current medical issues. Questions answered. Outlined signs and symptoms indicating need for more acute intervention. Patient verbalized understanding. After Visit Summary given.   SUBJECTIVE:  Jorge Williamson is a 31 y.o. male who reports a sore throat. LEFt-sided. Onset gradual beginning sev days ago; up to one week ago. No respiratory symptoms. Normal PO intake but reports discomfort with swallowing. No specific alleviating factors. Fever: absent. No neck pain or swelling. No associated nausea, vomiting, or abdominal pain. Known sick contacts: none. Recent travel: none. No tx PTA.   OBJECTIVE:  Vitals:   02/18/22 1850  BP: 120/73  Pulse: (!) 101  Resp: 16  Temp: 99.8 F (37.7 C)  TempSrc: Oral  SpO2: 98%    P of 101 noted.  General appearance: alert; no distress HEENT: throat with mild erythema with L  tonsil enlargement; no exudate; uvula is midline Neck: supple with FROM; no lymphadenopathy Lungs: speaks full sentences without difficulty; unlabored Abd: soft; non-tender Skin: reveals no rash; warm and dry Psychological: alert and cooperative; normal mood and affect  No Known Allergies  History reviewed. No pertinent past medical history. Social History   Socioeconomic History   Marital status: Single    Spouse name: Not on file   Number of children: Not on file   Years of education: Not on file   Highest education level: Not on file  Occupational History   Not on file  Tobacco Use   Smoking status: Former   Smokeless tobacco: Former  Building services engineer Use: Never used  Substance and Sexual Activity   Alcohol use: No   Drug use: No   Sexual activity: Yes    Birth control/protection: None, Condom  Other Topics Concern   Not on file  Social History Narrative   Not on file   Social Determinants of Health   Financial Resource Strain: Not on file  Food Insecurity: Not on file  Transportation Needs: Not on file  Physical Activity: Not on file  Stress: Not on file  Social Connections: Not on file  Intimate Partner Violence: Not on file   History reviewed. No pertinent family history.         Mardella Layman, MD 02/21/22 1023

## 2022-08-13 ENCOUNTER — Encounter (HOSPITAL_COMMUNITY): Payer: Self-pay | Admitting: Emergency Medicine

## 2022-08-13 ENCOUNTER — Ambulatory Visit (HOSPITAL_COMMUNITY)
Admission: EM | Admit: 2022-08-13 | Discharge: 2022-08-13 | Disposition: A | Discharge status: Home / Self Care | Payer: Self-pay | Attending: Nurse Practitioner | Admitting: Nurse Practitioner

## 2022-08-13 ENCOUNTER — Telehealth (HOSPITAL_COMMUNITY): Payer: Self-pay | Admitting: Nurse Practitioner

## 2022-08-13 DIAGNOSIS — Z202 Contact with and (suspected) exposure to infections with a predominantly sexual mode of transmission: Secondary | ICD-10-CM | POA: Insufficient documentation

## 2022-08-13 DIAGNOSIS — Z113 Encounter for screening for infections with a predominantly sexual mode of transmission: Secondary | ICD-10-CM | POA: Insufficient documentation

## 2022-08-13 MED ORDER — METRONIDAZOLE 50 MG/ML ORAL SUSPENSION: 2000.0000 mg | Freq: Once | ORAL | 0 refills | Status: DC

## 2022-08-13 MED ORDER — METRONIDAZOLE 500 MG PO TABS: 500.0000 mg | ORAL_TABLET | Freq: Two times a day (BID) | ORAL | 0 refills | Status: AC

## 2022-08-13 NOTE — ED Triage Notes (Signed)
Pt reports an exposure to Trich. States he has mild penile irritation. Denies dysuria and discharge

## 2022-08-13 NOTE — ED Provider Notes (Signed)
[facility]    CSN: [id number] Arrival date & time: 08/13/22  1500      History   Chief Complaint Chief Complaint  Patient presents with   SEXUALLY TRANSMITTED DISEASE    HPI Jorge Williamson is a 32 y.o. male.    Subjective:  Jorge Williamson is a 32 y.o. male who complains of mild penile irritation at the glans penis for a week or so.  He denies any dysuria, penile discharge, hematuria or genital sores.  Patient was informed by his sexual partner that she tested positive for trichomonas.  Patient reports a history of gonorrhea and trichomonas in the past.  He has had 2 male sexual partners within the past 3 months and uses condoms "sometimes."   The following portions of the patient's history were reviewed and updated as appropriate: allergies, current medications, past family history, past medical history, past social history, past surgical history, and problem list.       History reviewed. No pertinent past medical history.  There are no problems to display for this patient.   History reviewed. No pertinent surgical history.     Home Medications    Prior to Admission medications   Medication Sig Start Date End Date Taking? Authorizing Provider  metroNIDAZOLE (FLAGYL) 50 mg/ml oral suspension Take 40 mLs (2,000 mg total) by mouth once for 1 dose. 08/13/22 08/13/22 Yes Lurline Idol, FNP    Family History History reviewed. No pertinent family history.  Social History Social History   Tobacco Use   Smoking status: Former   Smokeless tobacco: Former  Building services engineer Use: Never used  Substance Use Topics   Alcohol use: No   Drug use: No     Allergies   Patient has no known allergies.   Review of Systems Review of Systems  Constitutional:  Negative for fever.  Gastrointestinal:  Negative for abdominal pain, nausea and vomiting.  Genitourinary:  Negative for dysuria, frequency, genital sores, hematuria, penile discharge,  penile pain, penile swelling, scrotal swelling and testicular pain.  Musculoskeletal:  Negative for gait problem.  All other systems reviewed and are negative.    Physical Exam Triage Vital Signs ED Triage Vitals  Enc Vitals Group     BP 08/13/22 1535 (!) 126/54     Pulse Rate 08/13/22 1535 64     Resp 08/13/22 1535 18     Temp 08/13/22 1535 98.9 F (37.2 C)     Temp Source 08/13/22 1535 Oral     SpO2 08/13/22 1535 98 %     Weight --      Height --      Head Circumference --      Peak Flow --      Pain Score 08/13/22 1536 0     Pain Loc --      Pain Edu? --      Excl. in GC? --    No data found.  Updated Vital Signs BP (!) 126/54 (BP Location: Left Arm)   Pulse 64   Temp 98.9 F (37.2 C) (Oral)   Resp 18   SpO2 98%   Visual Acuity Right Eye Distance:   Left Eye Distance:   Bilateral Distance:    Right Eye Near:   Left Eye Near:    Bilateral Near:     Physical Exam Vitals reviewed.  Constitutional:      Appearance: Normal appearance.  HENT:     Head: Normocephalic.  Mouth/Throat:     Mouth: Mucous membranes are moist.  Cardiovascular:     Rate and Rhythm: Normal rate.  Pulmonary:     Effort: Pulmonary effort is normal.  Genitourinary:    Comments: Deferred; patient performed self-swab for testing  Musculoskeletal:        General: Normal range of motion.     Cervical back: Normal range of motion and neck supple.  Skin:    General: Skin is warm and dry.  Neurological:     General: No focal deficit present.     Mental Status: He is alert and oriented to person, place, and time.      UC Treatments / Results  Labs (all labs ordered are listed, but only abnormal results are displayed) Labs Reviewed  CYTOLOGY, (ORAL, ANAL, URETHRAL) ANCILLARY ONLY    EKG   Radiology No results found.  Procedures Procedures (including critical care time)  Medications Ordered in UC Medications - No data to display  Initial Impression / Assessment and  Plan / UC Course  I have reviewed the triage vital signs and the nursing notes.  Pertinent labs & imaging results that were available during my care of the patient were reviewed by me and considered in my medical decision making (see chart for details).    32 yo male senting with mild penile irritation and known exposure to trichomonas.  No dysuria, penile discharge, hematuria or genital sores.  Testing for gonorrhea, chlamydia and trichomonas pending.  Will go ahead and treat with Flagyl 2 g x 1 given known exposure.  Further recommendations pending results of outstanding test.  Offered additional STI screening and patient declined.  Sex practices encouraged.  Today's evaluation has revealed no signs of a dangerous process. Discussed diagnosis with patient and/or guardian. Patient and/or guardian aware of their diagnosis, possible red flag symptoms to watch out for and need for close follow up. Patient and/or guardian understands verbal and written discharge instructions. Patient and/or guardian comfortable with plan and disposition.  Patient and/or guardian has a clear mental status at this time, good insight into illness (after discussion and teaching) and has clear judgment to make decisions regarding their care  Documentation was completed with the aid of voice recognition software. Transcription may contain typographical errors. Final Clinical Impressions(s) / UC Diagnoses   Final diagnoses:  Exposure to trichomonas  Screening examination for STD (sexually transmitted disease)     Discharge Instructions      Testing for gonorrhea, chlamydia and trichomonas is pending. You should not have any sexual activity until you receive the results of the tests. You will only be notified for positive results. You may go online to MyChart and review your results. Practice safe sex practices by wearing a condom every time you have sex. Remember that people who have STIs may not experience any symptoms.  However, even without symptoms, these infections can be spread from person to person and require treatment. STIs can be treated, and many STIs can be cured. However, some STIs cannot be cured and will affect you for the rest of your life. It's important to be checked regularly for STIs. You should also consider taking pre-exposure prophylaxis (PrEP) to prevent HIV infection.     ED Prescriptions     Medication Sig Dispense Auth. Provider   metroNIDAZOLE (FLAGYL) 50 mg/ml oral suspension Take 40 mLs (2,000 mg total) by mouth once for 1 dose. 40 mL Lurline Idol, FNP      PDMP not reviewed this  encounter.   Lurline Idol, [place] 08/13/22 [phone]

## 2022-08-13 NOTE — Discharge Instructions (Addendum)
Testing for gonorrhea, chlamydia and trichomonas is pending. You should not have any sexual activity until you receive the results of the tests. You will only be notified for positive results. You may go online to MyChart and review your results. Practice safe sex practices by wearing a condom every time you have sex. Remember that people who have STIs may not experience any symptoms. However, even without symptoms, these infections can be spread from person to person and require treatment. STIs can be treated, and many STIs can be cured. However, some STIs cannot be cured and will affect you for the rest of your life. It's important to be checked regularly for STIs. You should also consider taking pre-exposure prophylaxis (PrEP) to prevent HIV infection. 

## 2022-08-14 LAB — CYTOLOGY, (ORAL, ANAL, URETHRAL) ANCILLARY ONLY
Chlamydia: NEGATIVE
Comment: NEGATIVE
Comment: NEGATIVE
Comment: NORMAL
Neisseria Gonorrhea: NEGATIVE
Trichomonas: NEGATIVE

## 2022-10-15 ENCOUNTER — Ambulatory Visit (HOSPITAL_COMMUNITY)
Admission: EM | Admit: 2022-10-15 | Discharge: 2022-10-15 | Disposition: A | Discharge status: Home / Self Care | Payer: Self-pay | Attending: Internal Medicine | Admitting: Internal Medicine

## 2022-10-15 ENCOUNTER — Encounter (HOSPITAL_COMMUNITY): Payer: Self-pay | Admitting: Emergency Medicine

## 2022-10-15 DIAGNOSIS — Z9189 Other specified personal risk factors, not elsewhere classified: Secondary | ICD-10-CM

## 2022-10-15 NOTE — ED Triage Notes (Signed)
Pt reports that was positive for trich before and reports that he didn't wait the entire time before having intercourse again so wants STD testing.  Reports little irritation that is intermittent

## 2022-10-15 NOTE — ED Provider Notes (Addendum)
[facility]    CSN: [id number] Arrival date & time: 10/15/22  1932      History   Chief Complaint Chief Complaint  Patient presents with   SEXUALLY TRANSMITTED DISEASE    HPI Jorge Williamson is a 32 y.o. male.   Patient presents to urgent care for evaluation of penile irritation that started a few days ago. He remembers that he tested positive for trichomonas in May 2024 but did not wait the entire 7 days before resuming sexual intercourse.  He requests retesting today. On chart review, patient was treated for trichomonas with flagyl but never actually tested positive for STD. Sexually active with 2 male partners unprotected, no known exposures. No dysuria, penile discharge, urinary frequency, urinary urgency, or urinary hesitancy. No abdominal pain, N/V, or hematuria.  Admits to frequent intake of urinary irritants with poor water intake.      History reviewed. No pertinent past medical history.  There are no problems to display for this patient.   History reviewed. No pertinent surgical history.     Home Medications    Prior to Admission medications   Not on File    Family History No family history on file.  Social History Social History   Tobacco Use   Smoking status: Former   Smokeless tobacco: Former  Building services engineer status: Never Used  Substance Use Topics   Alcohol use: No   Drug use: No     Allergies   Patient has no known allergies.   Review of Systems Review of Systems Per HPI  Physical Exam Triage Vital Signs ED Triage Vitals [10/15/22 2001]  Encounter Vitals Group     BP (!) 145/74     Systolic BP Percentile      Diastolic BP Percentile      Pulse Rate 72     Resp 16     Temp 98.6 F (37 C)     Temp Source Oral     SpO2 98 %     Weight      Height      Head Circumference      Peak Flow      Pain Score 0     Pain Loc      Pain Education      Exclude from Growth Chart    No data found.  Updated Vital  Signs BP (!) 145/74 (BP Location: Left Arm)   Pulse 72   Temp 98.6 F (37 C) (Oral)   Resp 16   SpO2 98%   Visual Acuity Right Eye Distance:   Left Eye Distance:   Bilateral Distance:    Right Eye Near:   Left Eye Near:    Bilateral Near:     Physical Exam Vitals and nursing note reviewed.  Constitutional:      Appearance: He is not ill-appearing or toxic-appearing.  HENT:     Head: Normocephalic and atraumatic.     Right Ear: Hearing and external ear normal.     Left Ear: Hearing and external ear normal.     Nose: Nose normal.     Mouth/Throat:     Lips: Pink.  Eyes:     General: Lids are normal. Vision grossly intact. Gaze aligned appropriately.     Extraocular Movements: Extraocular movements intact.     Conjunctiva/sclera: Conjunctivae normal.  Pulmonary:     Effort: Pulmonary effort is normal.  Genitourinary:    Comments: Deferred. Musculoskeletal:  Cervical back: Neck supple.  Skin:    General: Skin is warm and dry.     Capillary Refill: Capillary refill takes less than 2 seconds.     Findings: No rash.  Neurological:     General: No focal deficit present.     Mental Status: He is alert and oriented to person, place, and time. Mental status is at baseline.     Cranial Nerves: No dysarthria or facial asymmetry.  Psychiatric:        Mood and Affect: Mood normal.        Speech: Speech normal.        Behavior: Behavior normal.        Thought Content: Thought content normal.        Judgment: Judgment normal.      UC Treatments / Results  Labs (all labs ordered are listed, but only abnormal results are displayed) Labs Reviewed  CYTOLOGY, (ORAL, ANAL, URETHRAL) ANCILLARY ONLY    EKG   Radiology No results found.  Procedures Procedures (including critical care time)  Medications Ordered in UC Medications - No data to display  Initial Impression / Assessment and Plan / UC Course  I have reviewed the triage vital signs and the nursing  notes.  Pertinent labs & imaging results that were available during my care of the patient were reviewed by me and considered in my medical decision making (see chart for details).   1. At risk for STD due to unprotected sex STI labs pending.  Patient declines HIV and syphilis testing today.  Will notify patient of positive results and treat accordingly when labs come back.  Patient to avoid sexual intercourse until screening testing comes back.  Education provided regarding safe sexual practices and patient encouraged to use protection to prevent spread of STIs.   Encouraged adequate fluid intake and decreased intake of urinary irritants to improve symptoms.  Counseled patient on potential for adverse effects with medications prescribed/recommended today, strict ER and return-to-clinic precautions discussed, patient verbalized understanding.    Final Clinical Impressions(s) / UC Diagnoses   Final diagnoses:  At risk for sexually transmitted infection due to unprotected sex     Discharge Instructions      Your STD testing has been sent to the lab and will come back in the next 2 to 3 days.  We will call you if any of your results are positive requiring treatment and treat you at that time.   If you do not receive a phone call from [place], this means your testing was negative.  Avoid sexual intercourse until your STD results come back.  If any of your STD results are positive, you will need to avoid sexual intercourse for 7 days while you are being treated to prevent spread of STD.  Condom use is the best way to prevent spread of STDs.  Return to urgent care as needed.      ED Prescriptions   None    PDMP not reviewed this encounter.   Carlisle Beers, FNP 10/17/22 1846    Carlisle Beers, FNP 10/17/22 [phone]

## 2022-10-15 NOTE — Discharge Instructions (Signed)
Your STD testing has been sent to the lab and will come back in the next 2 to 3 days.  We will call you if any of your results are positive requiring treatment and treat you at that time.   If you do not receive a phone call from us, this means your testing was negative.  Avoid sexual intercourse until your STD results come back.  If any of your STD results are positive, you will need to avoid sexual intercourse for 7 days while you are being treated to prevent spread of STD.  Condom use is the best way to prevent spread of STDs.  Return to urgent care as needed.  

## 2022-10-16 LAB — CYTOLOGY, (ORAL, ANAL, URETHRAL) ANCILLARY ONLY: Comment: NORMAL

## 2023-05-25 ENCOUNTER — Encounter (HOSPITAL_COMMUNITY): Payer: Self-pay | Admitting: *Deleted

## 2023-05-25 ENCOUNTER — Ambulatory Visit (HOSPITAL_COMMUNITY)
Admission: EM | Admit: 2023-05-25 | Discharge: 2023-05-25 | Disposition: A | Discharge status: Home / Self Care | Payer: Self-pay

## 2023-05-25 DIAGNOSIS — N50819 Testicular pain, unspecified: Secondary | ICD-10-CM

## 2023-05-25 LAB — POCT URINALYSIS DIP (MANUAL ENTRY)
Bilirubin, UA: NEGATIVE
Blood, UA: NEGATIVE
Glucose, UA: NEGATIVE mg/dL
Ketones, POC UA: NEGATIVE mg/dL
Leukocytes, UA: NEGATIVE
Nitrite, UA: NEGATIVE
Protein Ur, POC: NEGATIVE mg/dL
Spec Grav, UA: 1.015 (ref 1.010–1.025)
Urobilinogen, UA: 0.2 U/dL
pH, UA: 7 (ref 5.0–8.0)

## 2023-05-25 NOTE — ED Triage Notes (Signed)
 He states every 5-10 seconds he gets a tingling in his left testicle. Denies any other sx.   Pt states if he needs med he would like "fluid" medication.

## 2023-05-25 NOTE — Discharge Instructions (Addendum)
  Testicular discomfort -Urinalysis performed in UC shows no leukocytes, no nitrite, no blood, no sign of urinary tract infection or epididymal infection. -Outpatient ultrasound ordered for follow-up tomorrow to rule out epididymitis versus varicocele as possible cause of testicular discomfort -Continue to monitor symptoms if you develop worsening pain, severe abdominal discomfort, or any other urinary symptoms follow-up in ER immediately for further evaluation management.

## 2023-05-25 NOTE — ED Provider Notes (Signed)
 [facility]    CSN: [id number] Arrival date & time: 05/25/23  1927      History   Chief Complaint Chief Complaint  Patient presents with   Testicle Pain    HPI Jorge Williamson is a 33 y.o. male.   Patient reports several intermittent episodes of testicular discomfort/tingling to his left testicle that occurred throughout the day today.  Patient denies any recent trauma or injury to the testicle.  Patient denies any severe pain, no penile discharge, no dysuria, no significant swelling to the testicle.  Patient denies any abdominal pain or abnormal lump or swelling to the epididymis.    Testicle Pain    History reviewed. No pertinent past medical history.  There are no active problems to display for this patient.   History reviewed. No pertinent surgical history.     Home Medications    Prior to Admission medications   Not on File    Family History History reviewed. No pertinent family history.  Social History Social History   Tobacco Use   Smoking status: Former   Smokeless tobacco: Former  Building services engineer status: Never Used  Substance Use Topics   Alcohol use: No   Drug use: No     Allergies   Patient has no known allergies.   Review of Systems Review of Systems  Genitourinary:  Positive for testicular pain.  Review HPI for pertinent ROS.   Physical Exam Triage Vital Signs ED Triage Vitals  Encounter Vitals Group     BP 05/25/23 1941 128/70     Systolic BP Percentile --      Diastolic BP Percentile --      Pulse Rate 05/25/23 1941 73     Resp 05/25/23 1941 18     Temp 05/25/23 1941 98.3 F (36.8 C)     Temp Source 05/25/23 1941 Oral     SpO2 05/25/23 1941 98 %     Weight --      Height --      Head Circumference --      Peak Flow --      Pain Score 05/25/23 1940 0     Pain Loc --      Pain Education --      Exclude from Growth Chart --    No data found.  Updated Vital Signs BP 128/70 (BP Location: Left Arm)    Pulse 73   Temp 98.3 F (36.8 C) (Oral)   Resp 18   SpO2 98%   Visual Acuity Right Eye Distance:   Left Eye Distance:   Bilateral Distance:    Right Eye Near:   Left Eye Near:    Bilateral Near:     Physical Exam Vitals and nursing note reviewed.  Constitutional:      General: He is not in acute distress.    Appearance: Normal appearance. He is normal weight. He is not ill-appearing or toxic-appearing.  HENT:     Head: Normocephalic.  Cardiovascular:     Rate and Rhythm: Normal rate.  Pulmonary:     Effort: Pulmonary effort is normal.  Abdominal:     Palpations: Abdomen is soft.     Tenderness: There is no abdominal tenderness. There is no guarding or rebound.     Hernia: No hernia is present.  Genitourinary:    Pubic Area: No rash.      Penis: No discharge.      Testes:  Right: Tenderness not present.        Left: Tenderness and swelling present. Testicular hydrocele or varicocele not present.     Epididymis:     Left: Enlarged. Tenderness present.  Skin:    General: Skin is warm and dry.  Neurological:     General: No focal deficit present.     Mental Status: He is alert and oriented to person, place, and time.  Psychiatric:        Mood and Affect: Mood normal.        Behavior: Behavior normal.        Thought Content: Thought content normal.        Judgment: Judgment normal.      UC Treatments / Results  Labs (all labs ordered are listed, but only abnormal results are displayed) Labs Reviewed  POCT URINALYSIS DIP (MANUAL ENTRY)    EKG   Radiology No results found.  Procedures Procedures (including critical care time)  Medications Ordered in UC Medications - No data to display  Initial Impression / Assessment and Plan / UC Course  I have reviewed the triage vital signs and the nursing notes.  Pertinent labs & imaging results that were available during my care of the patient were reviewed by me and considered in my medical decision  making (see chart for details).   Testicular discomfort -Urinalysis performed in UC shows no leukocytes, no nitrite, no blood, no sign of urinary tract infection or epididymal infection. -Outpatient ultrasound ordered for follow-up tomorrow to rule out epididymitis versus varicocele as possible cause of testicular discomfort -Continue to monitor symptoms if you develop worsening pain, severe abdominal discomfort, or any other urinary symptoms follow-up in ER immediately for further evaluation management. 888 Final Clinical Impressions(s) / UC Diagnoses   Final diagnoses:  Testicular discomfort     Discharge Instructions       Testicular discomfort -Urinalysis performed in UC shows no leukocytes, no nitrite, no blood, no sign of urinary tract infection or epididymal infection. -Outpatient ultrasound ordered for follow-up tomorrow to rule out epididymitis versus varicocele as possible cause of testicular discomfort -Continue to monitor symptoms if you develop worsening pain, severe abdominal discomfort, or any other urinary symptoms follow-up in ER immediately for further evaluation management.     ED Prescriptions   None    PDMP not reviewed this encounter.   Pola Corn B, NP 05/25/23 2014

## 2023-05-26 ENCOUNTER — Telehealth: Payer: Self-pay

## 2023-05-26 NOTE — Telephone Encounter (Signed)
 Follow-up with outpatient ultrasound at [facility] for outpatient scrotal ultrasound to rule out varicocele versus epididymitis.

## 2023-05-31 ENCOUNTER — Ambulatory Visit (HOSPITAL_COMMUNITY)
Admission: RE | Admit: 2023-05-31 | Discharge: 2023-05-31 | Disposition: A | Discharge status: Home / Self Care | Payer: Self-pay | Source: Ambulatory Visit

## 2023-05-31 DIAGNOSIS — I861 Scrotal varices: Secondary | ICD-10-CM | POA: Insufficient documentation

## 2023-06-06 ENCOUNTER — Encounter (HOSPITAL_COMMUNITY): Payer: Self-pay

## 2023-07-27 ENCOUNTER — Encounter (HOSPITAL_COMMUNITY): Payer: Self-pay

## 2023-07-27 ENCOUNTER — Encounter (HOSPITAL_COMMUNITY): Payer: Self-pay | Admitting: Physician Assistant

## 2023-07-27 ENCOUNTER — Ambulatory Visit (HOSPITAL_COMMUNITY)
Admission: EM | Admit: 2023-07-27 | Discharge: 2023-07-27 | Disposition: A | Discharge status: Home / Self Care | Payer: Self-pay | Attending: Physician Assistant | Admitting: Physician Assistant

## 2023-07-27 DIAGNOSIS — K219 Gastro-esophageal reflux disease without esophagitis: Secondary | ICD-10-CM | POA: Insufficient documentation

## 2023-07-27 DIAGNOSIS — R1010 Upper abdominal pain, unspecified: Secondary | ICD-10-CM | POA: Insufficient documentation

## 2023-07-27 LAB — CBC WITH DIFFERENTIAL/PLATELET
Abs Immature Granulocytes: 0.01 10*3/uL (ref 0.00–0.07)
Basophils Absolute: 0.1 10*3/uL (ref 0.0–0.1)
Basophils Relative: 1 %
Eosinophils Absolute: 0.1 10*3/uL (ref 0.0–0.5)
Eosinophils Relative: 3 %
HCT: 43.9 % (ref 39.0–52.0)
Hemoglobin: 13.7 g/dL (ref 13.0–17.0)
Immature Granulocytes: 0 %
Lymphocytes Relative: 46 %
Lymphs Abs: 2.3 10*3/uL (ref 0.7–4.0)
MCH: 23.5 pg — ABNORMAL LOW (ref 26.0–34.0)
MCHC: 31.2 g/dL (ref 30.0–36.0)
MCV: 75.3 fL — ABNORMAL LOW (ref 80.0–100.0)
Monocytes Absolute: 0.3 10*3/uL (ref 0.1–1.0)
Monocytes Relative: 7 %
Neutro Abs: 2.2 10*3/uL (ref 1.7–7.7)
Neutrophils Relative %: 43 %
Platelets: 281 10*3/uL (ref 150–400)
RBC: 5.83 MIL/uL — ABNORMAL HIGH (ref 4.22–5.81)
RDW: 13.6 % (ref 11.5–15.5)
WBC: 5 10*3/uL (ref 4.0–10.5)
nRBC: 0 % (ref 0.0–0.2)

## 2023-07-27 LAB — COMPREHENSIVE METABOLIC PANEL WITH GFR
ALT: 17 U/L (ref 0–44)
AST: 22 U/L (ref 15–41)
Albumin: 4.6 g/dL (ref 3.5–5.0)
Alkaline Phosphatase: 54 U/L (ref 38–126)
Anion gap: 9 (ref 5–15)
BUN: 12 mg/dL (ref 6–20)
CO2: 27 mmol/L (ref 22–32)
Calcium: 9.9 mg/dL (ref 8.9–10.3)
Chloride: 103 mmol/L (ref 98–111)
Creatinine, Ser: 1.21 mg/dL (ref 0.61–1.24)
GFR, Estimated: >60 mL/min (ref >60)
Glucose, Bld: 141 mg/dL — ABNORMAL HIGH (ref 70–99)
Potassium: 3.8 mmol/L (ref 3.5–5.1)
Sodium: 139 mmol/L (ref 135–145)
Total Bilirubin: 0.9 mg/dL (ref 0.0–1.2)
Total Protein: 8.2 g/dL — ABNORMAL HIGH (ref 6.5–8.1)

## 2023-07-27 LAB — LIPASE, BLOOD: Lipase: 42 U/L (ref 11–51)

## 2023-07-27 MED ORDER — ALUM & MAG HYDROXIDE-SIMETH 200-200-20 MG/5ML PO SUSP
ORAL | Status: AC
  Filled 2023-07-27: qty 30

## 2023-07-27 MED ORDER — FAMOTIDINE 40 MG/5ML PO SUSR: 40.0000 mg | Freq: Every day | ORAL | 0 refills | Status: AC

## 2023-07-27 MED ORDER — ALUM & MAG HYDROXIDE-SIMETH 200-200-20 MG/5ML PO SUSP
30.0000 mL | Freq: Once | ORAL | Status: AC
  Administered 2023-07-27: 30 mL via ORAL

## 2023-07-27 NOTE — ED Triage Notes (Signed)
 Pt present with mid abd pain x 2 weeks. States he has been having abnormal BM's, c/o back pain

## 2023-07-27 NOTE — ED Provider Notes (Signed)
 [facility]    CSN: [id number] Arrival date & time: 07/27/23  1703      History   Chief Complaint Chief Complaint  Patient presents with   Abdominal Pain    HPI Jorge Williamson is a 33 y.o. male.   Patient presents today with a 2-week history of epigastric and left upper quadrant abdominal pain.  He reports that currently pain is rated 2/3 on a 0-10 pain scale, described as an aching, no aggravating relieving factors notified.  He denies any recent dietary changes.  He is having bowel movements once every 36 hours which is slightly less frequent than normal but denies any significant constipation or obstipation.  Denies any fever, nausea, vomiting.  He has not taken any over-the-counter medications.  Denies any recent dietary changes or medication changes.  He used to drink alcohol regularly but has not been doing so in several weeks.  He does not take NSAIDs regularly.  Denies history of gastrointestinal disorder including GERD.  He denies any melena or hematochezia.    History reviewed. No pertinent past medical history.  There are no active problems to display for this patient.   History reviewed. No pertinent surgical history.     Home Medications    Prior to Admission medications   Medication Sig Start Date End Date Taking? Authorizing Provider  famotidine (PEPCID) 40 MG/5ML suspension Take 5 mLs (40 mg total) by mouth at bedtime. 07/27/23  Yes Araya Roel, Betsey Brow, PA-C    Family History History reviewed. No pertinent family history.  Social History Social History   Tobacco Use   Smoking status: Former   Smokeless tobacco: Former  Building services engineer status: Never Used  Substance Use Topics   Alcohol use: No   Drug use: No     Allergies   Patient has no known allergies.   Review of Systems Review of Systems  Constitutional:  Positive for activity change. Negative for appetite change, fatigue and fever.  Respiratory:  Negative for cough and  shortness of breath.   Cardiovascular:  Negative for chest pain.  Gastrointestinal:  Positive for abdominal pain. Negative for constipation, diarrhea, nausea and vomiting.  Genitourinary:  Negative for dysuria, frequency and urgency.     Physical Exam Triage Vital Signs ED Triage Vitals [07/27/23 1911]  Encounter Vitals Group     BP 124/89     Systolic BP Percentile      Diastolic BP Percentile      Pulse Rate 65     Resp 18     Temp 98.4 F (36.9 C)     Temp Source Oral     SpO2 98 %     Weight      Height      Head Circumference      Peak Flow      Pain Score 2     Pain Loc      Pain Education      Exclude from Growth Chart    No data found.  Updated Vital Signs BP 124/89 (BP Location: Right Arm)   Pulse 65   Temp 98.4 F (36.9 C) (Oral)   Resp 18   SpO2 98%   Visual Acuity Right Eye Distance:   Left Eye Distance:   Bilateral Distance:    Right Eye Near:   Left Eye Near:    Bilateral Near:     Physical Exam Vitals reviewed.  Constitutional:      General: He  is awake.     Appearance: Normal appearance. He is well-developed. He is not ill-appearing.     Comments: Very pleasant male appears stated age in no acute distress and comfortable in exam room  HENT:     Head: Normocephalic and atraumatic.     Mouth/Throat:     Pharynx: Uvula midline. No oropharyngeal exudate or posterior oropharyngeal erythema.  Cardiovascular:     Rate and Rhythm: Normal rate and regular rhythm.     Heart sounds: Normal heart sounds, S1 normal and S2 normal. No murmur heard. Pulmonary:     Effort: Pulmonary effort is normal.     Breath sounds: Normal breath sounds. No stridor. No wheezing, rhonchi or rales.  Abdominal:     General: Bowel sounds are normal.     Palpations: Abdomen is soft.     Tenderness: There is abdominal tenderness in the epigastric area and left upper quadrant. There is no right CVA tenderness, left CVA tenderness, guarding or rebound.     Comments: Mild  tenderness palpation in epigastrium and left upper quadrant.  No evidence of acute abdomen on physical exam.  Neurological:     Mental Status: He is alert.  Psychiatric:        Behavior: Behavior is cooperative.      UC Treatments / Results  Labs (all labs ordered are listed, but only abnormal results are displayed) Labs Reviewed  CBC WITH DIFFERENTIAL/PLATELET  COMPREHENSIVE METABOLIC PANEL WITH GFR  LIPASE, BLOOD    EKG   Radiology No results found.  Procedures Procedures (including critical care time)  Medications Ordered in UC Medications  alum & mag hydroxide-simeth (MAALOX/MYLANTA) 200-200-20 MG/5ML suspension 30 mL (30 mLs Oral Given 07/27/23 1936)    Initial Impression / Assessment and Plan / UC Course  I have reviewed the triage vital signs and the nursing notes.  Pertinent labs & imaging results that were available during my care of the patient were reviewed by me and considered in my medical decision making (see chart for details).     Patient is well-appearing, afebrile, nontoxic, nontachycardic.  Vital signs and physical exam are reassuring with no indication for emergent evaluation or imaging.  He was given Maalox with significant improvement of symptoms.  Suspect GERD as etiology of symptoms and will start Pepcid nightly (he preferred liquid medication over pills).  He was encouraged to use dietary and lifestyle modification for additional symptom relief.  Basic blood work including CBC, CMP, lipase obtained we will contact him if this is abnormal and changes our treatment plan.  Discussed that if his symptoms are not improving quickly he should follow-up with gastroenterology and was given the contact information for local provider with instruction to call to schedule an appointment.  Discussed that if anything worsens and he has severe abdominal pain, focal abdominal pain, fever, nausea, vomiting, melena, hematochezia he needs to be seen emergently.  Strict  return precautions given.  Excuse note provided.  Final Clinical Impressions(s) / UC Diagnoses   Final diagnoses:  Gastroesophageal reflux disease without esophagitis  Upper abdominal pain     Discharge Instructions      I am glad that you are feeling better with the medication.  Start Pepcid nightly for the next 2 weeks.  Avoid spicy/acidic/fatty foods.  Avoid alcohol and NSAIDs (aspirin, ibuprofen /Advil , naproxen/Aleve).  Follow-up with stomach specialist if your symptoms do not go away.  I will contact you if any of your blood work is abnormal.  If anything worsens  and you have severe pain, nausea, vomiting, weakness, blood in your stool, blood in your vomit you need to go to the ER.     ED Prescriptions     Medication Sig Dispense Auth. Provider   famotidine (PEPCID) 40 MG/5ML suspension Take 5 mLs (40 mg total) by mouth at bedtime. 70 mL Janae Bonser K, PA-C      PDMP not reviewed this encounter.   Budd Cargo, PA-C 07/27/23 2014

## 2023-07-27 NOTE — Discharge Instructions (Signed)
 I am glad that you are feeling better with the medication.  Start Pepcid nightly for the next 2 weeks.  Avoid spicy/acidic/fatty foods.  Avoid alcohol and NSAIDs (aspirin, ibuprofen /Advil , naproxen/Aleve).  Follow-up with stomach specialist if your symptoms do not go away.  I will contact you if any of your blood work is abnormal.  If anything worsens and you have severe pain, nausea, vomiting, weakness, blood in your stool, blood in your vomit you need to go to the ER.

## 2023-10-19 ENCOUNTER — Encounter (HOSPITAL_COMMUNITY): Payer: Self-pay | Admitting: *Deleted

## 2023-10-19 ENCOUNTER — Other Ambulatory Visit: Payer: Self-pay

## 2023-10-19 ENCOUNTER — Ambulatory Visit (HOSPITAL_COMMUNITY)
Admission: EM | Admit: 2023-10-19 | Discharge: 2023-10-19 | Disposition: A | Discharge status: Home / Self Care | Payer: Self-pay

## 2023-10-19 DIAGNOSIS — Z0289 Encounter for other administrative examinations: Secondary | ICD-10-CM

## 2023-10-19 NOTE — Discharge Instructions (Signed)
 Your work note is attached to the back of your work.

## 2023-10-19 NOTE — ED Triage Notes (Signed)
 PT presents for a work note.

## 2023-10-19 NOTE — ED Provider Notes (Signed)
 [facility]    CSN: [id number] Arrival date & time: 10/19/23  1925      History   Chief Complaint No chief complaint on file.   HPI Jorge Williamson is a 33 y.o. male.   Patient presents requesting a work note.  Patient denies any complaints at this time.  Patient states that he had a headache yesterday that resolved on its own, but did not present to work today and just wants a work note stating he can return tomorrow.  The history is provided by the patient and medical records.    History reviewed. No pertinent past medical history.  There are no active problems to display for this patient.   History reviewed. No pertinent surgical history.     Home Medications    Prior to Admission medications   Medication Sig Start Date End Date Taking? Authorizing Provider  famotidine  (PEPCID ) 40 MG/5ML suspension Take 5 mLs (40 mg total) by mouth at bedtime. 07/27/23   Raspet, Rocky POUR, PA-C    Family History History reviewed. No pertinent family history.  Social History Social History   Tobacco Use   Smoking status: Former   Smokeless tobacco: Former  Building services engineer status: Never Used  Substance Use Topics   Alcohol use: No   Drug use: No     Allergies   Patient has no known allergies.   Review of Systems Review of Systems  Per HPI  Physical Exam Triage Vital Signs ED Triage Vitals  Encounter Vitals Group     BP 10/19/23 1935 131/84     Girls Systolic BP Percentile --      Girls Diastolic BP Percentile --      Boys Systolic BP Percentile --      Boys Diastolic BP Percentile --      Pulse Rate 10/19/23 1935 80     Resp 10/19/23 1935 18     Temp 10/19/23 1935 98.8 F (37.1 C)     Temp src --      SpO2 10/19/23 1935 96 %     Weight --      Height --      Head Circumference --      Peak Flow --      Pain Score 10/19/23 1933 0     Pain Loc --      Pain Education --      Exclude from Growth Chart --    No data found.  Updated  Vital Signs BP 131/84   Pulse 80   Temp 98.8 F (37.1 C)   Resp 18   SpO2 96%   Visual Acuity Right Eye Distance:   Left Eye Distance:   Bilateral Distance:    Right Eye Near:   Left Eye Near:    Bilateral Near:     Physical Exam Vitals and nursing note reviewed.  Constitutional:      General: He is awake. He is not in acute distress.    Appearance: Normal appearance. He is well-developed and well-groomed. He is not ill-appearing.  Neurological:     Mental Status: He is alert.  Psychiatric:        Behavior: Behavior is cooperative.      UC Treatments / Results  Labs (all labs ordered are listed, but only abnormal results are displayed) Labs Reviewed - No data to display  EKG   Radiology No results found.  Procedures Procedures (including critical care time)  Medications Ordered  in UC Medications - No data to display  Initial Impression / Assessment and Plan / UC Course  I have reviewed the triage vital signs and the nursing notes.  Pertinent labs & imaging results that were available during my care of the patient were reviewed by me and considered in my medical decision making (see chart for details).     Patient is overall well-appearing.  Vitals are stable.  Provided patient with return department.  Discussed follow-up and return precautions. Final Clinical Impressions(s) / UC Diagnoses   Final diagnoses:  Encounter to obtain excuse from work     Discharge Instructions      Your work note is attached to the back of your work.   ED Prescriptions   None    PDMP not reviewed this encounter.   Johnie Flaming A, NP 10/19/23 [phone]

## 2023-10-21 ENCOUNTER — Ambulatory Visit (HOSPITAL_COMMUNITY): Admission: EM | Admit: 2023-10-21 | Discharge: 2023-10-21 | Discharge status: Against Medical Advice | Payer: Self-pay

## 2023-10-21 NOTE — ED Notes (Signed)
 Pt states he would like us  to change the work excise from the other visit on 7/29 he needs it to say he was out the day before 10/18/2023 and can go back tomorrow. I have advised the pt we can not go back and change a date on a note and we can't extend it longer than 3 days. I confirmed he has no sx he needs only the note today . He denies any sx and states he didn't have any on the visit for 10/19/2023, but he has missed work and wants a note. He states he understands and doesn't need anything today since we can't change his work note from 10/19/2023
